# Patient Record
Sex: Female | Born: 1995 | Race: Black or African American | Hispanic: No | Smoking: Never smoker
Health system: Southern US, Community
[De-identification: ages and names within clinical notes are randomized; demographics above are authoritative.]

## PROBLEM LIST (undated history)

## (undated) DIAGNOSIS — I10 Essential (primary) hypertension: Secondary | ICD-10-CM

## (undated) DIAGNOSIS — F429 Obsessive-compulsive disorder, unspecified: Secondary | ICD-10-CM

## (undated) HISTORY — PX: WISDOM TOOTH EXTRACTION: SHX21

## (undated) HISTORY — DX: Obsessive-compulsive disorder, unspecified: F42.9

## (undated) HISTORY — PX: WRIST GANGLION EXCISION: SHX840

---
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
## 2013-02-08 ENCOUNTER — Ambulatory Visit
Admission: RE | Admit: 2013-02-08 | Discharge: 2013-02-08 | Disposition: A | Discharge status: Home / Self Care | Payer: Medicaid Other | Source: Ambulatory Visit | Attending: Pediatrics | Admitting: Pediatrics

## 2013-02-08 ENCOUNTER — Other Ambulatory Visit: Payer: Self-pay | Admitting: Pediatrics

## 2013-02-08 DIAGNOSIS — R079 Chest pain, unspecified: Secondary | ICD-10-CM

## 2013-05-08 ENCOUNTER — Other Ambulatory Visit: Payer: Self-pay | Admitting: *Deleted

## 2013-05-08 DIAGNOSIS — N390 Urinary tract infection, site not specified: Secondary | ICD-10-CM

## 2013-05-08 MED ORDER — NITROFURANTOIN MONOHYD MACRO 100 MG PO CAPS: 100.0000 mg | ORAL_CAPSULE | Freq: Two times a day (BID) | ORAL | Status: DC

## 2013-05-08 NOTE — Progress Notes (Signed)
Pt called to schedule an annual exam. Pt has c/o UTI symptoms. Burning with urination and odr to her urine. Rx sent to pharmacy. Pt to call back if symptoms persist.

## 2013-07-12 ENCOUNTER — Encounter: Payer: Self-pay | Admitting: Obstetrics

## 2013-07-12 ENCOUNTER — Ambulatory Visit: Payer: Self-pay | Admitting: Obstetrics

## 2013-07-12 ENCOUNTER — Ambulatory Visit (INDEPENDENT_AMBULATORY_CARE_PROVIDER_SITE_OTHER): Payer: Medicaid Other | Admitting: Obstetrics

## 2013-07-12 VITALS — BP 137/85 | HR 98 | Temp 97.5°F | Wt 125.0 lb

## 2013-07-12 DIAGNOSIS — Z3009 Encounter for other general counseling and advice on contraception: Secondary | ICD-10-CM | POA: Insufficient documentation

## 2013-07-12 DIAGNOSIS — B373 Candidiasis of vulva and vagina: Secondary | ICD-10-CM

## 2013-07-12 DIAGNOSIS — Z113 Encounter for screening for infections with a predominantly sexual mode of transmission: Secondary | ICD-10-CM

## 2013-07-12 DIAGNOSIS — N760 Acute vaginitis: Secondary | ICD-10-CM

## 2013-07-12 DIAGNOSIS — Z309 Encounter for contraceptive management, unspecified: Secondary | ICD-10-CM

## 2013-07-12 DIAGNOSIS — Z3202 Encounter for pregnancy test, result negative: Secondary | ICD-10-CM

## 2013-07-12 DIAGNOSIS — Z01419 Encounter for gynecological examination (general) (routine) without abnormal findings: Secondary | ICD-10-CM

## 2013-07-12 DIAGNOSIS — R300 Dysuria: Secondary | ICD-10-CM

## 2013-07-12 LAB — POCT URINALYSIS DIPSTICK
Bilirubin, UA: NEGATIVE
Ketones, UA: NEGATIVE
Spec Grav, UA: 1.02
pH, UA: 7

## 2013-07-12 MED ORDER — NITROFURANTOIN MONOHYD MACRO 100 MG PO CAPS: 100.0000 mg | ORAL_CAPSULE | Freq: Two times a day (BID) | ORAL | Status: DC

## 2013-07-12 NOTE — Progress Notes (Signed)
Subjective:    Regina Ramos is a 17 y.o. female who presents for sexually transmitted disease check and contraception consult. Sexual history reviewed with the patient. STI Exposure: denies knowledge of risky exposure. Previous history of STI trichomonas. Current symptoms vaginal discharge: white, thick and malodorous, vaginal irritation: moderate, spotting, dysuria: moderate, abnormal bleeding. Contraception: condoms Menstrual History: OB History   Grav Para Term Preterm Abortions TAB SAB Ect Mult Living   0 0 0 0 0 0 0 0 0 0       Menarche age: 61  Patient's last menstrual period was 06/12/2013.    The following portions of the patient's history were reviewed and updated as appropriate: allergies, current medications, past family history, past medical history, past social history, past surgical history and problem list.  Review of Systems Pertinent items are noted in HPI.    Objective:    BP 137/85  Pulse 98  Temp(Src) 97.5 F (36.4 C) (Oral)  Wt 125 lb (56.7 kg)  LMP 06/12/2013 General:   alert and no distress  Lymph Nodes:   Cervical, supraclavicular, and axillary nodes normal.  Pelvis:  External genitalia: normal general appearance Vaginal: normal mucosa without prolapse or lesions and discharge, grey and malodorous Cervix: normal appearance Adnexa: normal bimanual exam Uterus: normal single, nontender  Cultures:  GC and Chlamydia genprobes and wet prep     Assessment:     Healthy female exam.   Vaginitis, candida and BV      Contraceptive options discussed.   Plan:    Discussed safe sexual practice in detail Appropriate educational material was distributed See orders for STD cultures and assays Pt will call for results RTC 1 year.

## 2013-07-13 ENCOUNTER — Encounter: Payer: Self-pay | Admitting: Obstetrics

## 2013-07-13 DIAGNOSIS — B373 Candidiasis of vulva and vagina: Secondary | ICD-10-CM | POA: Insufficient documentation

## 2013-07-13 LAB — WET PREP BY MOLECULAR PROBE
Gardnerella vaginalis: NEGATIVE
Trichomonas vaginosis: NEGATIVE

## 2013-07-13 LAB — GC/CHLAMYDIA PROBE AMP: GC Probe RNA: NEGATIVE

## 2013-07-13 LAB — HIV ANTIBODY (ROUTINE TESTING W REFLEX): HIV: NONREACTIVE

## 2013-07-13 MED ORDER — METRONIDAZOLE 500 MG PO TABS: 500.0000 mg | ORAL_TABLET | Freq: Two times a day (BID) | ORAL | Status: DC

## 2013-07-13 MED ORDER — FLUCONAZOLE 150 MG PO TABS: 150.0000 mg | ORAL_TABLET | Freq: Once | ORAL | Status: DC

## 2013-07-19 ENCOUNTER — Telehealth: Payer: Self-pay | Admitting: *Deleted

## 2013-07-19 NOTE — Telephone Encounter (Signed)
ATC pt 07/17/13 at 1736 hrs and received fax modem ring tone, hung up and tried again and received busy signal.  Pt given prescription for treatment at office visit. Pt reports is still taking Flagyl. I advised pt to continue same and take already prescribed Diflucan after she completes that course of antibiotic. Pt advised STD results being negative. Pt expressed understanding.

## 2013-08-03 ENCOUNTER — Encounter: Payer: Self-pay | Admitting: Obstetrics

## 2013-08-03 ENCOUNTER — Ambulatory Visit (INDEPENDENT_AMBULATORY_CARE_PROVIDER_SITE_OTHER): Payer: Medicaid Other | Admitting: Obstetrics

## 2013-08-03 VITALS — BP 121/73 | HR 108 | Temp 97.7°F | Wt 127.0 lb

## 2013-08-03 DIAGNOSIS — Z3202 Encounter for pregnancy test, result negative: Secondary | ICD-10-CM

## 2013-08-03 DIAGNOSIS — Z30017 Encounter for initial prescription of implantable subdermal contraceptive: Secondary | ICD-10-CM

## 2013-08-03 LAB — POCT URINE PREGNANCY: Preg Test, Ur: NEGATIVE

## 2013-08-03 NOTE — Progress Notes (Signed)
NEXPLANON INSERTION NOTE  Date of LMP:   07/30/2013  Contraception used: Abstinence  Pregnancy test result:  Lab Results  Component Value Date   PREGTESTUR Negative 08/03/2013    Indications:  The patient desires contraception.  She understands risks, benefits, and alternatives to Implanon and would like to proceed.  Anesthesia:   Lidocaine 1% plain.  Procedure:  A time-out was performed confirming the procedure and the patient's allergy status.  The patient's non-dominant was identified as the left arm.  The protection cap was removed. While placing countertraction on the skin, the needle was inserted at a 30 degree angle.  The applicator was held horizontal to the skin; the skin was tented upward as the needle was introduced into the subdermal space.  While holding the applicator in place, the slider was unlocked. The Nexplanon was removed from the field.  The Nexplanon was palpated to ensure proper placement.  Complications: None  Instructions:  The patient was instructed to remove the dressing in 24 hours and that some bruising is to be expected.  She was advised to use over the counter analgesics as needed for any pain at the site.  She is to keep the area dry for 24 hours and to call if her hand or arm becomes cold, numb, or blue.  Return visit:  Return in 2 weeks

## 2013-08-04 ENCOUNTER — Encounter: Payer: Self-pay | Admitting: Obstetrics

## 2013-08-24 ENCOUNTER — Ambulatory Visit: Payer: Medicaid Other | Admitting: Obstetrics

## 2013-08-31 ENCOUNTER — Ambulatory Visit: Payer: Medicaid Other | Admitting: Obstetrics

## 2013-09-07 ENCOUNTER — Ambulatory Visit (INDEPENDENT_AMBULATORY_CARE_PROVIDER_SITE_OTHER): Payer: Medicaid Other | Admitting: Obstetrics

## 2013-09-07 VITALS — BP 121/81 | HR 72 | Temp 99.0°F | Ht 66.0 in | Wt 125.0 lb

## 2013-09-07 DIAGNOSIS — Z113 Encounter for screening for infections with a predominantly sexual mode of transmission: Secondary | ICD-10-CM

## 2013-09-07 DIAGNOSIS — R300 Dysuria: Secondary | ICD-10-CM

## 2013-09-07 DIAGNOSIS — Z3046 Encounter for surveillance of implantable subdermal contraceptive: Secondary | ICD-10-CM

## 2013-09-07 LAB — POCT URINALYSIS DIPSTICK
Glucose, UA: NEGATIVE
Nitrite, UA: NEGATIVE
Protein, UA: NEGATIVE
Spec Grav, UA: 1.015
Urobilinogen, UA: NEGATIVE
pH, UA: 7

## 2013-09-07 NOTE — Progress Notes (Signed)
Subjective:     Regina Ramos is a 17 y.o. female here for a routine exam.  Current complaints: Patient is in the office for follow up to the Nexplanon. Patient reports she has had some prolonged bleeding, but it is light and seems it is going to stop. Patient is having some Stinging with urination a. Patient is requesting a blood test today , also.  Personal health questionnaire reviewed: yes.   Gynecologic History No LMP recorded. Patient has had an implant. Contraception: Nexplanon   Obstetric History OB History  Gravida Para Term Preterm AB SAB TAB Ectopic Multiple Living  0 0 0 0 0 0 0 0 0 0          The following portions of the patient's history were reviewed and updated as appropriate: allergies, current medications, past family history, past medical history, past social history, past surgical history and problem list.  Review of Systems Pertinent items are noted in HPI.    Objective:    PE:  Left arm:  Rod palpated.  Site well  Assessment:    Healthy female exam.  S/P Nexplanon insertion.   Plan:    Education reviewed: safe sex/STD prevention. Contraception: Nexplanon. Follow up in: several months.

## 2013-09-08 ENCOUNTER — Encounter: Payer: Self-pay | Admitting: Obstetrics

## 2013-09-08 LAB — HEPATITIS B SURFACE ANTIGEN: Hepatitis B Surface Ag: NEGATIVE

## 2014-03-08 ENCOUNTER — Emergency Department (HOSPITAL_COMMUNITY)
Admission: EM | Admit: 2014-03-08 | Discharge: 2014-03-08 | Disposition: A | Discharge status: Home / Self Care | Payer: No Typology Code available for payment source | Attending: Emergency Medicine | Admitting: Emergency Medicine

## 2014-03-08 ENCOUNTER — Encounter (HOSPITAL_COMMUNITY): Payer: Self-pay | Admitting: Emergency Medicine

## 2014-03-08 DIAGNOSIS — IMO0002 Reserved for concepts with insufficient information to code with codable children: Secondary | ICD-10-CM | POA: Insufficient documentation

## 2014-03-08 DIAGNOSIS — Z79899 Other long term (current) drug therapy: Secondary | ICD-10-CM | POA: Insufficient documentation

## 2014-03-08 DIAGNOSIS — Z8659 Personal history of other mental and behavioral disorders: Secondary | ICD-10-CM | POA: Insufficient documentation

## 2014-03-08 DIAGNOSIS — Y92410 Unspecified street and highway as the place of occurrence of the external cause: Secondary | ICD-10-CM | POA: Insufficient documentation

## 2014-03-08 DIAGNOSIS — Y9389 Activity, other specified: Secondary | ICD-10-CM | POA: Insufficient documentation

## 2014-03-08 DIAGNOSIS — M549 Dorsalgia, unspecified: Secondary | ICD-10-CM

## 2014-03-08 MED ORDER — IBUPROFEN 800 MG PO TABS: 800.0000 mg | ORAL_TABLET | Freq: Three times a day (TID) | ORAL | Status: DC

## 2014-03-08 MED ORDER — IBUPROFEN 800 MG PO TABS
800.0000 mg | ORAL_TABLET | Freq: Once | ORAL | Status: AC
  Administered 2014-03-08: 800 mg via ORAL
  Filled 2014-03-08: qty 1

## 2014-03-08 NOTE — Discharge Instructions (Signed)
Take Ibuprofen as needed for pain. Refer to attached documents for more information.  °
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
## 2014-03-09 NOTE — ED Provider Notes (Signed)
Medical screening examination/treatment/procedure(s) were performed by non-physician practitioner and as supervising physician I was immediately available for consultation/collaboration.   Sherrika Weakland David Nikola Blackston III, MD 03/09/14 1242 

## 2014-09-06 ENCOUNTER — Emergency Department (HOSPITAL_COMMUNITY)
Admission: EM | Admit: 2014-09-06 | Discharge: 2014-09-06 | Disposition: A | Discharge status: Home / Self Care | Payer: Medicaid Other | Attending: Emergency Medicine | Admitting: Emergency Medicine

## 2014-09-06 ENCOUNTER — Encounter (HOSPITAL_COMMUNITY): Payer: Self-pay | Admitting: Emergency Medicine

## 2014-09-06 DIAGNOSIS — Z3202 Encounter for pregnancy test, result negative: Secondary | ICD-10-CM | POA: Diagnosis not present

## 2014-09-06 DIAGNOSIS — Z8659 Personal history of other mental and behavioral disorders: Secondary | ICD-10-CM | POA: Insufficient documentation

## 2014-09-06 DIAGNOSIS — R110 Nausea: Secondary | ICD-10-CM | POA: Insufficient documentation

## 2014-09-06 DIAGNOSIS — Z79899 Other long term (current) drug therapy: Secondary | ICD-10-CM | POA: Diagnosis not present

## 2014-09-06 DIAGNOSIS — R51 Headache: Secondary | ICD-10-CM | POA: Diagnosis present

## 2014-09-06 DIAGNOSIS — R519 Headache, unspecified: Secondary | ICD-10-CM

## 2014-09-06 LAB — POC URINE PREG, ED: Preg Test, Ur: NEGATIVE

## 2014-09-06 MED ORDER — KETOROLAC TROMETHAMINE 60 MG/2ML IM SOLN
60.0000 mg | Freq: Once | INTRAMUSCULAR | Status: AC
  Administered 2014-09-06: 60 mg via INTRAMUSCULAR
  Filled 2014-09-06: qty 2

## 2014-09-06 NOTE — Discharge Instructions (Signed)
Take the prescribed medication as directed. Follow-up with Dr. Cathey Endow to schedule an eye exam. Return to the ED for new or worsening symptoms.

## 2014-09-06 NOTE — ED Notes (Signed)
C/o nausea/headache

## 2014-09-06 NOTE — Progress Notes (Signed)
     CARE MANAGEMENT ED NOTE 09/06/2014  Patient:  Regina Ramos, Regina Ramos   Account Number:  [id number]  Date Initiated:  09/06/2014  Documentation initiated by:  Edd Arbour  Subjective/Objective Assessment:   18 yr old [place] C/o nausea/headache     Subjective/Objective Assessment Detail:   " I aged out of medicaid" I have just got new insurance through my job  No pcp listed for pt     Action/Plan:   see notes below   Action/Plan Detail:   Anticipated DC Date:  09/06/2014     Status Recommendation to Physician:   Result of Recommendation:    Other ED Services  Consult Working Plan    DC Planning Services  Other  PCP issues  Outpatient Services - Pt will follow up    Choice offered to / List presented to:            Status of service:  Completed, signed off  ED Comments:   ED Comments Detail:  [facility] ED CM spoke with pt on how to obtain an in network pcp with insurance coverage via the customer service number or web site Cm reviewed ED level of care for crisis/emergent services and community pcp level of care to manage continuous or chronic medical concerns.  The pt voiced understanding CM encouraged pt and discussed pt's responsibility to verify with pt's insurance carrier that any recommended medical provider offered by any emergency room or a hospital provider is within the carrier's network. The pt voiced understanding

## 2014-09-06 NOTE — ED Provider Notes (Signed)
CSN: [id number]     Arrival date & time 09/06/14  1415 History  This chart was scribed for non-physician practitioner, Sharilyn Sites, PA-C working with Suzi Roots, MD by Greggory Stallion, ED scribe. This patient was seen in room WTR7/WTR7 and the patient's care was started at 3:10 PM.   Chief Complaint  Patient presents with  . Headache   The history is provided by the patient. No language interpreter was used.   HPI Comments: Regina Ramos is a 18 y.o. female who presents to the Emergency Department complaining of intermittent headaches that started about 2 weeks ago. Reports associated nausea and photophobia.  States headache occurs around both of her eyes. Denies head injury or trauma to eye. States pain worse with reading or trying to concentrate.  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 Pt has not seen an eye doctor in a while, wears glasses on a daily basis. She has taken aleve with no relief. States it normally helps with regular headaches.  No fever, chills, sweats, neck pain, vomiting, visual disturbance, tinnitus, changes in speech, difficulty ambulating.  Past Medical History  Diagnosis Date  . OCD (obsessive compulsive disorder)    Past Surgical History  Procedure Laterality Date  . Wrist ganglion excision Left   . Wisdom tooth extraction     Family History  Problem Relation Age of Onset  . Adopted: Yes   History  Substance Use Topics  . Smoking status: Never Smoker   . Smokeless tobacco: Never Used  . Alcohol Use: No   OB History   Grav Para Term Preterm Abortions TAB SAB Ect Mult Living       Review of Systems  Eyes: Positive for photophobia.  Gastrointestinal: Positive for nausea.  Neurological: Positive for headaches.  All other systems reviewed and are negative.  Allergies  Codeine  Home Medications   Prior to Admission medications   Medication Sig  Start Date End Date Taking? Authorizing Provider  etonogestrel (NEXPLANON) 68 MG IMPL implant Inject 1 each into the skin once.    Historical Provider, MD  ibuprofen (ADVIL,MOTRIN) 800 MG tablet Take 1 tablet (800 mg total) by mouth 3 (three) times daily. 03/08/14   Kaitlyn Szekalski, PA-C  lansoprazole (PREVACID) 30 MG capsule Take 30 mg by mouth daily.    Historical Provider, MD   BP 139/95  Pulse 88  Temp(Src) 98.2 F (36.8 C) (Oral)  Resp 16  SpO2 100%  Physical Exam  Nursing note and vitals reviewed. Constitutional: She is oriented to person, place, and time. She appears well-developed and well-nourished. No distress.  HENT:  Head: Normocephalic and atraumatic.  Mouth/Throat: Oropharynx is clear and moist.  Eyes: Conjunctivae and EOM are normal. Pupils are equal, round, and reactive to light.  Neck: Normal range of motion and full passive range of motion without pain. Neck supple. No spinous process tenderness and no muscular tenderness present. No rigidity. Normal range of motion present.  No meningismus  Cardiovascular: Normal rate, regular rhythm and normal heart sounds.   Pulmonary/Chest: Effort normal and breath sounds normal. No respiratory distress. She has no wheezes.  Abdominal: Soft. Bowel sounds are normal.  Musculoskeletal: Normal range of motion.  Neurological: She is alert and oriented to person, place, and time.  AAOx3, answering questions appropriately; equal strength UE and LE bilaterally; CN grossly intact;  moves all extremities appropriately without ataxia; no focal neuro deficits or facial asymmetry appreciated  Skin: Skin is warm and dry. She is not diaphoretic.  Psychiatric: She has a normal mood and affect.    ED Course  Procedures (including critical care time)  DIAGNOSTIC STUDIES: Oxygen Saturation is 100% on RA, normal by my interpretation.    COORDINATION OF CARE: 3:14 PM-Discussed treatment plan which includes headache medications with pt at  bedside and pt agreed to plan. Advised pt to follow up with her eye doctor.   Labs Review Labs Reviewed  POC URINE PREG, ED    Imaging Review No results found.   EKG Interpretation None      MDM   Final diagnoses:  Headache, unspecified headache type   Intermittent headaches x2 weeks.  No focal neurologic deficits or meningismus on exam. Patient is afebrile overall nontoxic.  Given toradol with some improvement of headache.  Sx likely due to eye strain given her new job working on [employer].  Will have patient FU with ophthalmology to schedule routine eye exam at has been several years since her last exam.  Discussed plan with patient, he/she acknowledged understanding and agreed with plan of care.  Return precautions given for new or worsening symptoms.  I personally performed the services described in this documentation, which was scribed in my presence. The recorded information has been reviewed and is accurate.  Garlon Hatchet, PA-C 09/06/14 [phone]

## 2014-09-07 NOTE — ED Provider Notes (Signed)
Medical screening examination/treatment/procedure(s) were performed by non-physician practitioner and as supervising physician I was immediately available for consultation/collaboration.    Rahkim Rabalais E [place] Giammona, MD 09/07/14 0729 

## 2014-10-24 ENCOUNTER — Encounter (HOSPITAL_COMMUNITY): Payer: Self-pay | Admitting: *Deleted

## 2014-10-24 DIAGNOSIS — F42 Obsessive-compulsive disorder: Secondary | ICD-10-CM | POA: Diagnosis not present

## 2014-10-24 DIAGNOSIS — Z3202 Encounter for pregnancy test, result negative: Secondary | ICD-10-CM | POA: Diagnosis not present

## 2014-10-24 DIAGNOSIS — R300 Dysuria: Secondary | ICD-10-CM | POA: Insufficient documentation

## 2014-10-24 DIAGNOSIS — B373 Candidiasis of vulva and vagina: Secondary | ICD-10-CM | POA: Diagnosis not present

## 2014-10-24 DIAGNOSIS — N898 Other specified noninflammatory disorders of vagina: Secondary | ICD-10-CM | POA: Diagnosis present

## 2014-10-24 NOTE — ED Notes (Signed)
The pt is c/o a vaginal discharge and her urine has a foul smell for 2 weeks.  lmp  None inplanbt

## 2014-10-25 ENCOUNTER — Emergency Department (HOSPITAL_COMMUNITY)
Admission: EM | Admit: 2014-10-25 | Discharge: 2014-10-25 | Disposition: A | Discharge status: Home / Self Care | Payer: BC Managed Care – PPO | Attending: Emergency Medicine | Admitting: Emergency Medicine

## 2014-10-25 DIAGNOSIS — B3731 Acute candidiasis of vulva and vagina: Secondary | ICD-10-CM

## 2014-10-25 DIAGNOSIS — B373 Candidiasis of vulva and vagina: Secondary | ICD-10-CM | POA: Diagnosis not present

## 2014-10-25 LAB — URINE MICROSCOPIC-ADD ON

## 2014-10-25 LAB — WET PREP, GENITAL: Trich, Wet Prep: NONE SEEN

## 2014-10-25 LAB — URINALYSIS, ROUTINE W REFLEX MICROSCOPIC
BILIRUBIN URINE: NEGATIVE
GLUCOSE, UA: NEGATIVE mg/dL
Ketones, ur: NEGATIVE mg/dL
Nitrite: NEGATIVE
PROTEIN: NEGATIVE mg/dL
SPECIFIC GRAVITY, URINE: 1.031 — AB (ref 1.005–1.030)
UROBILINOGEN UA: 1 mg/dL (ref 0.0–1.0)
pH: 6 (ref 5.0–8.0)

## 2014-10-25 LAB — PREGNANCY, URINE: Preg Test, Ur: NEGATIVE

## 2014-10-25 MED ORDER — FLUCONAZOLE 100 MG PO TABS
150.0000 mg | ORAL_TABLET | Freq: Once | ORAL | Status: AC
  Administered 2014-10-25: 150 mg via ORAL
  Filled 2014-10-25: qty 2

## 2014-10-25 NOTE — ED Provider Notes (Signed)
CSN: [id number]     Arrival date & time 10/24/14  2336 History   First MD Initiated Contact with Patient 10/25/14 0147     Chief Complaint  Patient presents with  . Vaginal Discharge    (Consider location/radiation/quality/duration/timing/severity/associated sxs/prior Treatment) HPI Comments: Patient is a 18 year old female with a history of OCD who presents to the emergency department for further evaluation of vaginal discharge. Patient states that her vaginal discharge began a few weeks ago, but has worsened over the last 4 days. Patient states that she noticed that her discharge has become thicker. She also has noticed a urinary odor and labial sensitivity. Patient states that flexion against her genital area causes worsening discomfort. She states that she has noticed a skin tear near her clitoris which has increased pain when urinating. Patient denies any recent sexual intercourse or dyspareunia. She denies associated fever, abdominal pain, nausea, vomiting, genital lesions, and hematuria. Patient denies taking any medications for symptoms. She has had one sexual partner in the last 6 months. She endorses a concern for STDs. Her last STD check was one year ago. No history of prior pregnancies.  Patient is a 18 y.o. female presenting with vaginal discharge. The history is provided by the patient. No language interpreter was used.  Vaginal Discharge Associated symptoms: dysuria     Past Medical History  Diagnosis Date  . OCD (obsessive compulsive disorder)    Past Surgical History  Procedure Laterality Date  . Wrist ganglion excision Left   . Wisdom tooth extraction     Family History  Problem Relation Age of Onset  . Adopted: Yes   History  Substance Use Topics  . Smoking status: Never Smoker   . Smokeless tobacco: Never Used  . Alcohol Use: No   OB History    Gravida Para Term Preterm AB TAB SAB Ectopic Multiple Living   0 0 0 0 0 0 0 0 0 0       Review of Systems   Genitourinary: Positive for dysuria and vaginal discharge. Negative for hematuria and vaginal bleeding.  Skin: Positive for wound.  All other systems reviewed and are negative.   Allergies  Codeine  Home Medications   Prior to Admission medications   Medication Sig Start Date End Date Taking? Authorizing Provider  etonogestrel (NEXPLANON) 68 MG IMPL implant Inject 1 each into the skin once.    Historical Provider, MD  naproxen sodium (ANAPROX) 220 MG tablet Take 220 mg by mouth 2 (two) times daily with a meal.    Historical Provider, MD   BP 133/89 mmHg  Pulse 92  Temp(Src) 98.6 F (37 C) (Oral)  Resp 14  Ht 5\' 7"  (1.702 m)  Wt 150 lb (68.04 kg)  BMI 23.49 kg/m2  SpO2 99%   Physical Exam  Constitutional: She is oriented to person, place, and time. She appears well-developed and well-nourished. No distress.  Nontoxic/nonseptic appearing  HENT:  Head: Normocephalic and atraumatic.  Eyes: Conjunctivae and EOM are normal. No scleral icterus.  Neck: Normal range of motion.  Pulmonary/Chest: Effort normal. No respiratory distress.  Chest expansion symmetric. Respirations even and unlabored.  Abdominal: Soft. She exhibits no distension. There is no tenderness. There is no rebound and no guarding.  Soft and nontender. No peritoneal signs or masses.  Genitourinary: There is no rash, tenderness, lesion or injury on the right labia. There is no rash, tenderness, lesion or injury on the left labia. Uterus is not tender. Cervix exhibits no motion tenderness and  no friability. Right adnexum displays no mass, no tenderness and no fullness. Left adnexum displays no mass, no tenderness and no fullness. No bleeding in the vagina. No foreign body around the vagina. Vaginal discharge (thick white, curd-like discharge) found.  Musculoskeletal: Normal range of motion.  Neurological: She is alert and oriented to person, place, and time. She exhibits normal muscle tone. Coordination normal.  GCS 15.  Speech is goal oriented. Patient moves extremities without ataxia.  Skin: Skin is warm and dry. No rash noted. She is not diaphoretic. No erythema. No pallor.  Psychiatric: She has a normal mood and affect. Her behavior is normal.  Nursing note and vitals reviewed.   ED Course  Procedures (including critical care time) Labs Review Labs Reviewed  WET PREP, GENITAL - Abnormal; Notable for the following:    Yeast Wet Prep HPF POC FEW (*)    Clue Cells Wet Prep HPF POC FEW (*)    WBC, Wet Prep HPF POC FEW (*)    All other components within normal limits  URINALYSIS, ROUTINE W REFLEX MICROSCOPIC - Abnormal; Notable for the following:    APPearance CLOUDY (*)    Specific Gravity, Urine 1.031 (*)    Hgb urine dipstick MODERATE (*)    Leukocytes, UA SMALL (*)    All other components within normal limits  URINE MICROSCOPIC-ADD ON - Abnormal; Notable for the following:    Squamous Epithelial / LPF MANY (*)    Bacteria, UA MANY (*)    All other components within normal limits  GC/CHLAMYDIA PROBE AMP  PREGNANCY, URINE    Imaging Review No results found.   EKG Interpretation None      MDM   Final diagnoses:  Candidal vulvovaginitis    18 year old female presents to the emergency department for further evaluation of vaginal discharge and vaginal irritation. No complaints of abdominal pain. Patient is afebrile on arrival to the emergency department. Patient found have no abdominal tenderness on exam. No adnexal or cervical motion tenderness. Urinalysis today is consistent with a contaminated sample given many squamous cells. She does endorse a skin tear to her labia/clitoral area. Suspect that this is the source of her RBCs in her urine today.  Yeast present in UA sample. Wet prep today also shows few yeast. Patient's symptoms and physical exam are c/w a yeast infection. Patient tx in ED with diflucan. GC/Chlamydia cultures are pending; patient will be notified if cultures are positive.  Patient instructed to f/u with her OB/GYN to further discuss symptoms today. She is stable for discharge at this time. Return precautions provided and patient discharged in good condition; VSS.   Filed Vitals:   10/24/14 2341 10/25/14 0303  BP: 153/97 133/89  Pulse: 106 92  Temp: 97.5 F (36.4 C) 98.6 F (37 C)  TempSrc:  Oral  Resp: 16 14  Height: 5\' 7"  (1.702 m)   Weight: 150 lb (68.04 kg)   SpO2: 98% 99%     Antony MaduraKelly Yareliz Thorstenson, PA-C 10/25/14 [id number]  Derwood KaplanAnkit Nanavati, MD 10/25/14 0840

## 2014-10-25 NOTE — ED Notes (Signed)
Patient is alert and orientedx4.  Patient was explained discharge instructions and they understood them with no questions.  The patient's boyfriend, Kris HartmannJaywin Fox is taking the patient home.

## 2014-10-25 NOTE — Discharge Instructions (Signed)
You have been treated for this in the ED with Diflucan. Follow up with your OBGYN. Return [facility] or to their outpatient clinic as needed if symptoms persist or worsen.  Candidal Vulvovaginitis Candidal vulvovaginitis is an infection of the vagina and vulva. The vulva is the skin around the opening of the vagina. This may cause itching and discomfort in and around the vagina.  HOME CARE  Only take medicine as told by your doctor.  Do not have sex (intercourse) until the infection is healed or as told by your doctor.  Practice safe sex.  Tell your sex partner about your infection.  Do not douche or use tampons.  Wear cotton underwear. Do not wear tight pants or panty hose.  Eat yogurt. This may help treat and prevent yeast infections. GET HELP RIGHT AWAY IF:   You have a fever.  Your problems get worse during treatment or do not get better in 3 days.  You have discomfort, irritation, or itching in your vagina or vulva area.  You have pain after sex.  You start to get belly (abdominal) pain. MAKE SURE YOU:  Understand these instructions.  Will watch your condition.  Will get help right away if you are not doing well or get worse. Document Released: 03/05/2009 Document Revised: 12/12/2013 Document Reviewed: 03/05/2009 [facility] Patient Information 2015 [place], [place]. This information is not intended to replace advice given to you by your health care provider. Make sure you discuss any questions you have with your health care provider.

## 2014-10-26 LAB — GC/CHLAMYDIA PROBE AMP
CT Probe RNA: NEGATIVE
GC Probe RNA: NEGATIVE

## 2014-10-31 ENCOUNTER — Encounter (HOSPITAL_COMMUNITY): Payer: Self-pay | Admitting: Emergency Medicine

## 2014-10-31 DIAGNOSIS — R51 Headache: Secondary | ICD-10-CM | POA: Insufficient documentation

## 2014-10-31 DIAGNOSIS — Z8659 Personal history of other mental and behavioral disorders: Secondary | ICD-10-CM | POA: Diagnosis not present

## 2014-10-31 DIAGNOSIS — Z79899 Other long term (current) drug therapy: Secondary | ICD-10-CM | POA: Insufficient documentation

## 2014-10-31 NOTE — ED Notes (Signed)
Pt. reports left side headache onset last week , denies nausea or vomitting / no blurred vision .

## 2014-11-01 ENCOUNTER — Emergency Department (HOSPITAL_COMMUNITY)
Admission: EM | Admit: 2014-11-01 | Discharge: 2014-11-01 | Disposition: A | Discharge status: Home / Self Care | Payer: BC Managed Care – PPO | Attending: Emergency Medicine | Admitting: Emergency Medicine

## 2014-11-01 DIAGNOSIS — R51 Headache: Secondary | ICD-10-CM | POA: Diagnosis not present

## 2014-11-01 DIAGNOSIS — R519 Headache, unspecified: Secondary | ICD-10-CM

## 2014-11-01 MED ORDER — DIPHENHYDRAMINE HCL 50 MG/ML IJ SOLN
25.0000 mg | Freq: Once | INTRAMUSCULAR | Status: AC
  Administered 2014-11-01: 25 mg via INTRAVENOUS
  Filled 2014-11-01: qty 1

## 2014-11-01 MED ORDER — PROCHLORPERAZINE EDISYLATE 5 MG/ML IJ SOLN
10.0000 mg | Freq: Once | INTRAMUSCULAR | Status: AC
  Administered 2014-11-01: 10 mg via INTRAVENOUS
  Filled 2014-11-01: qty 2

## 2014-11-01 NOTE — ED Provider Notes (Signed)
CSN: [id number]     Arrival date & time 10/31/14  2345 History   First MD Initiated Contact with Patient 11/01/14 0204     Chief Complaint  Patient presents with  . Headache     (Consider location/radiation/quality/duration/timing/severity/associated sxs/prior Treatment) HPI Comments: Patient is a [age] year old female presented to the emergency department for one week of intermittent episodes of throbbing headaches. Patient states it rotates sides, is currently on her left side. She denies any nausea, vomiting, visual disturbance, fever, chills, photophobia, phonophobia. She has tried Motrin intermittently with minimal improvement. She states she had a similar headache back in September, states "they gave me a shot in the emergency department and got better." She has not followed up with a primary care physician or a neurologist.   Past Medical History  Diagnosis Date  . OCD (obsessive compulsive disorder)    Past Surgical History  Procedure Laterality Date  . Wrist ganglion excision Left   . Wisdom tooth extraction     Family History  Problem Relation Age of Onset  . Adopted: Yes   History  Substance Use Topics  . Smoking status: Never Smoker   . Smokeless tobacco: Never Used  . Alcohol Use: No   OB History    Gravida Para Term Preterm AB TAB SAB Ectopic Multiple Living   0 0 0 0 0 0 0 0 0 0      Review of Systems  Neurological: Positive for headaches.  All other systems reviewed and are negative.     Allergies  Codeine  Home Medications   Prior to Admission medications   Medication Sig Start Date End Date Taking? Authorizing Provider  etonogestrel (NEXPLANON) 68 MG IMPL implant Inject 1 each into the skin once.   Yes Historical Provider, MD  naproxen sodium (ANAPROX) 220 MG tablet Take 220 mg by mouth 2 (two) times daily with a meal.   Yes Historical Provider, MD   BP 132/83 mmHg  Pulse 87  Temp(Src) 98.2 F (36.8 C)  Resp 16  Ht 5\' 7"  (1.702 m)  Wt 150 lb  (68.04 kg)  BMI 23.49 kg/m2  SpO2 100% Physical Exam  Constitutional: She is oriented to person, place, and time. She appears well-developed and well-nourished. No distress.  HENT:  Head: Normocephalic and atraumatic.  Right Ear: External ear normal.  Left Ear: External ear normal.  Nose: Nose normal.  Mouth/Throat: Oropharynx is clear and moist. No oropharyngeal exudate.  Eyes: Conjunctivae and EOM are normal. Pupils are equal, round, and reactive to light.  Neck: Normal range of motion. Neck supple.  Cardiovascular: Normal rate, regular rhythm, normal heart sounds and intact distal pulses.   Pulmonary/Chest: Effort normal and breath sounds normal. No respiratory distress.  Abdominal: Soft. There is no tenderness.  Neurological: She is alert and oriented to person, place, and time. She has normal strength. No cranial nerve deficit. Gait normal. GCS eye subscore is 4. GCS verbal subscore is 5. GCS motor subscore is 6.  Sensation grossly intact.  No pronator drift.  Bilateral heel-knee-shin intact.  Skin: Skin is warm and dry. She is not diaphoretic.  Nursing note and vitals reviewed.   ED Course  Procedures (including critical care time) Medications  diphenhydrAMINE (BENADRYL) injection 25 mg (25 mg Intravenous Given 11/01/14 0232)  prochlorperazine (COMPAZINE) injection 10 mg (10 mg Intravenous Given 11/01/14 0232)    Labs Review Labs Reviewed - No data to display  Imaging Review No results found.   EKG Interpretation None  MDM   Final diagnoses:  Bad headache    Filed Vitals:   11/01/14 0230  BP: 140/79  Pulse: 80  Temp:   Resp:    Afebrile, NAD, non-toxic appearing, AAOx4.  Pt HA treated and improved while in ED.  Presentation is like pts typical HA and non concerning for [facility], ICH, Meningitis, or temporal arteritis. Pt is afebrile with no focal neuro deficits, nuchal rigidity, or change in vision. Pt is to follow up with PCP to discuss prophylactic  medication. Pt verbalizes understanding and is agreeable with plan to dc.  Patient is stable at time of discharge     Jeannetta EllisJennifer L Glendell Fouse, PA-C 11/02/14 0327  April K Palumbo-Rasch, MD 11/02/14 [id number]

## 2014-11-01 NOTE — ED Notes (Signed)
Pt A&OX4, ambulatory at d/c with steady gait, NAD 

## 2014-11-01 NOTE — Discharge Instructions (Signed)
Please follow up with your primary care physician in 1-2 days. If you do not have one please call the [facility] listed above. [facility] up [facility]. Please read all discharge instructions and return precautions. Please read all discharge instructions and return precautions.   General Headache Without Cause A headache is pain or discomfort felt around the head or neck area. The specific cause of a headache may not be found. There are many causes and types of headaches. A few common ones are:  Tension headaches.  Migraine headaches.  Cluster headaches.  Chronic daily headaches. HOME CARE INSTRUCTIONS   Keep all follow-up appointments with your caregiver or any specialist referral.  Only take over-the-counter or prescription medicines for pain or discomfort as directed by your caregiver.  Lie down in a dark, quiet room when you have a headache.  Keep a headache journal to find out what may trigger your migraine headaches. For example, write down:  What you eat and drink.  How much sleep you get.  Any change to your diet or medicines.  Try massage or other relaxation techniques.  Put ice packs or heat on the head and neck. Use these 3 to 4 times per day for 15 to 20 minutes each time, or as needed.  Limit stress.  Sit up straight, and do not tense your muscles.  Quit smoking if you smoke.  Limit alcohol use.  Decrease the amount of caffeine you drink, or stop drinking caffeine.  Eat and sleep on a regular schedule.  Get 7 to 9 hours of sleep, or as recommended by your caregiver.  Keep lights dim if bright lights bother you and make your headaches worse. SEEK MEDICAL CARE IF:   You have problems with the medicines you were prescribed.  Your medicines are not working.  You have a change from the usual headache.  You have nausea or vomiting. SEEK IMMEDIATE MEDICAL CARE IF:     Your headache becomes severe.  You have a fever.  You have a stiff neck.  You have loss of vision.  You have muscular weakness or loss of muscle control.  You start losing your balance or have trouble walking.  You feel faint or pass out.  You have severe symptoms that are different from your first symptoms. MAKE SURE YOU:   Understand these instructions.  Will watch your condition.  Will get help right away if you are not doing well or get worse. Document Released: 12/07/2005 Document Revised: 02/29/2012 Document Reviewed: 12/23/2011 [facility] Patient Information 2015 [place], [place]. This information is not intended to replace advice given to you by your health care provider. Make sure you discuss any questions you have with your health care provider.

## 2015-05-03 ENCOUNTER — Emergency Department (HOSPITAL_COMMUNITY)
Admission: EM | Admit: 2015-05-03 | Discharge: 2015-05-04 | Disposition: A | Discharge status: Home / Self Care | Payer: Medicaid Other | Attending: Emergency Medicine | Admitting: Emergency Medicine

## 2015-05-03 ENCOUNTER — Encounter (HOSPITAL_COMMUNITY): Payer: Self-pay | Admitting: Emergency Medicine

## 2015-05-03 ENCOUNTER — Emergency Department (HOSPITAL_COMMUNITY): Payer: Medicaid Other

## 2015-05-03 DIAGNOSIS — R110 Nausea: Secondary | ICD-10-CM | POA: Diagnosis not present

## 2015-05-03 DIAGNOSIS — Z79899 Other long term (current) drug therapy: Secondary | ICD-10-CM | POA: Insufficient documentation

## 2015-05-03 DIAGNOSIS — R0602 Shortness of breath: Secondary | ICD-10-CM | POA: Insufficient documentation

## 2015-05-03 DIAGNOSIS — R42 Dizziness and giddiness: Secondary | ICD-10-CM | POA: Diagnosis not present

## 2015-05-03 DIAGNOSIS — Z791 Long term (current) use of non-steroidal anti-inflammatories (NSAID): Secondary | ICD-10-CM | POA: Insufficient documentation

## 2015-05-03 DIAGNOSIS — R079 Chest pain, unspecified: Secondary | ICD-10-CM | POA: Diagnosis present

## 2015-05-03 DIAGNOSIS — Z3202 Encounter for pregnancy test, result negative: Secondary | ICD-10-CM | POA: Insufficient documentation

## 2015-05-03 LAB — BASIC METABOLIC PANEL
ANION GAP: 10 (ref 5–15)
BUN: 14 mg/dL (ref 6–20)
CALCIUM: 9.5 mg/dL (ref 8.9–10.3)
CO2: 25 mmol/L (ref 22–32)
Chloride: 103 mmol/L (ref 101–111)
Creatinine, Ser: 0.66 mg/dL (ref 0.44–1.00)
GFR calc Af Amer: >60 mL/min (ref >60)
GLUCOSE: 80 mg/dL (ref 65–99)
POTASSIUM: 4 mmol/L (ref 3.5–5.1)
Sodium: 138 mmol/L (ref 135–145)

## 2015-05-03 LAB — CBC
HEMATOCRIT: 39.5 % (ref 36.0–46.0)
Hemoglobin: 13.7 g/dL (ref 12.0–15.0)
MCH: 27.4 pg (ref 26.0–34.0)
MCHC: 34.7 g/dL (ref 30.0–36.0)
MCV: 79 fL (ref 78.0–100.0)
Platelets: 328 10*3/uL (ref 150–400)
RBC: 5 MIL/uL (ref 3.87–5.11)
RDW: 14.2 % (ref 11.5–15.5)
WBC: 10.5 10*3/uL (ref 4.0–10.5)

## 2015-05-03 LAB — I-STAT TROPONIN, ED: Troponin i, poc: 0 ng/mL (ref 0.00–0.08)

## 2015-05-03 LAB — BRAIN NATRIURETIC PEPTIDE: B NATRIURETIC PEPTIDE 5: 8.4 pg/mL (ref 0.0–100.0)

## 2015-05-03 LAB — POC URINE PREG, ED: Preg Test, Ur: NEGATIVE

## 2015-05-03 LAB — D-DIMER, QUANTITATIVE (NOT AT ARMC)

## 2015-05-03 MED ORDER — ONDANSETRON 4 MG PO TBDP
2.0000 mg | ORAL_TABLET | Freq: Once | ORAL | Status: AC
  Administered 2015-05-03: 2 mg via ORAL
  Filled 2015-05-03: qty 1

## 2015-05-03 NOTE — ED Notes (Signed)
C/o tightness to mid upper chest and tingling across chest since 6pm with sob and nausea.  States pain started while at work.

## 2015-05-03 NOTE — ED Provider Notes (Signed)
CSN: [id number]     Arrival date & time 05/03/15  2145 History   First MD Initiated Contact with Patient 05/03/15 2223     Chief Complaint  Patient presents with  . Chest Pain     (Consider location/radiation/quality/duration/timing/severity/associated sxs/prior Treatment) The history is provided by the patient. No language interpreter was used.  Regina Ramos is a 19 year old female with past medical history of OCD presenting to the ED with chest tightness, but patients, shortness of breath and tingling in her chest and shoulders with associated dizziness that started approximately 6:00 PM this evening while patient was at work. Patient reports she is a [employer]. Patient reported that this episode lasted for approximately 5 hours, disintegrating at approximately 11:00 PM. Patient reported that she was also experiencing nausea associated with it. Patient reports that at the time of the arrival to the hospital the tingling, dizziness and chest discomfort has assault. Reported that she continues to feel nauseous. Patient reported that she has been having some nasal congestion and a mild dry cough. Reported that she's been extremely stressed out between work and home life. Reported that she's taken nothing for discomfort. Reported that she is currently on Nexplanon August 2014. Denied throat closing sensation, fainting, blurred vision, sudden loss of vision, floaters, confusion, disorientation, vomiting, diarrhea, abdominal pain, hemoptysis, travels, leg swelling, melena, hematochezia, weakness, loss of sensation, fever, neck pain, neck stiffness, back pain. PCP none  Past Medical History  Diagnosis Date  . OCD (obsessive compulsive disorder)    Past Surgical History  Procedure Laterality Date  . Wrist ganglion excision Left   . Wisdom tooth extraction     Family History  Problem Relation Age of Onset  . Adopted: Yes   History  Substance Use Topics  . Smoking status: Never Smoker   .  Smokeless tobacco: Never Used  . Alcohol Use: No   OB History    Gravida Para Term Preterm AB TAB SAB Ectopic Multiple Living   0 0 0 0 0 0 0 0 0 0      Review of Systems  Constitutional: Negative for fever and chills.  Eyes: Negative for visual disturbance.  Respiratory: Positive for chest tightness and shortness of breath.   Cardiovascular: Positive for chest pain.  Gastrointestinal: Positive for nausea. Negative for vomiting, abdominal pain, diarrhea, constipation, blood in stool and anal bleeding.  Musculoskeletal: Negative for back pain, neck pain and neck stiffness.  Neurological: Positive for dizziness. Negative for weakness, numbness and headaches.      Allergies  Codeine  Home Medications   Prior to Admission medications   Medication Sig Start Date End Date Taking? Authorizing Provider  etonogestrel (NEXPLANON) 68 MG IMPL implant Inject 1 each into the skin once.   Yes Historical Provider, MD  lansoprazole (PREVACID) 30 MG capsule Take 30 mg by mouth daily at 12 noon.   Yes Historical Provider, MD  naproxen sodium (ANAPROX) 220 MG tablet Take 220 mg by mouth daily as needed (pain).    Yes Historical Provider, MD   BP 118/64 mmHg  Pulse 87  Temp(Src) 98.1 F (36.7 C) (Oral)  Resp 24  SpO2 99% Physical Exam  Constitutional: She is oriented to person, place, and time. She appears well-developed and well-nourished. No distress.  HENT:  Head: Normocephalic and atraumatic.  Mouth/Throat: Oropharynx is clear and moist. No oropharyngeal exudate.  Eyes: Conjunctivae and EOM are normal. Pupils are equal, round, and reactive to light. Right eye exhibits no discharge. Left eye exhibits  no discharge.  Neck: Normal range of motion. Neck supple. No tracheal deviation present.  Cardiovascular: Normal rate, regular rhythm and normal heart sounds.  Exam reveals no friction rub.   No murmur heard. Pulses:      Radial pulses are 2+ on the right side, and 2+ on the left side.        Dorsalis pedis pulses are 2+ on the right side, and 2+ on the left side.  Cap refill less than 3 seconds Negative swelling or pitting edema identified to lower extremities bilaterally  Pulmonary/Chest: Effort normal and breath sounds normal. No respiratory distress. She has no wheezes. She has no rales. She exhibits no tenderness.  Musculoskeletal: Normal range of motion.  Full ROM to upper and lower extremities without difficulty noted, negative ataxia noted.  Lymphadenopathy:    She has no cervical adenopathy.  Neurological: She is alert and oriented to person, place, and time. No cranial nerve deficit. She exhibits normal muscle tone. Coordination normal.  Skin: Skin is warm and dry. No rash noted. She is not diaphoretic. No erythema.  Psychiatric: She has a normal mood and affect. Her behavior is normal. Thought content normal.  Nursing note and vitals reviewed.   ED Course  Procedures (including critical care time)  Results for orders placed or performed during the hospital encounter of 05/03/15  CBC  Result Value Ref Range   WBC 10.5 4.0 - 10.5 K/uL   RBC 5.00 3.87 - 5.11 MIL/uL   Hemoglobin 13.7 12.0 - 15.0 g/dL   HCT 16.139.5 09.636.0 - 04.546.0 %   MCV 79.0 78.0 - 100.0 fL   MCH 27.4 26.0 - 34.0 pg   MCHC 34.7 30.0 - 36.0 g/dL   RDW 40.914.2 81.111.5 - 91.415.5 %   Platelets 328 150 - 400 K/uL  Basic metabolic panel  Result Value Ref Range   Sodium 138 135 - 145 mmol/L   Potassium 4.0 3.5 - 5.1 mmol/L   Chloride 103 101 - 111 mmol/L   CO2 25 22 - 32 mmol/L   Glucose, Bld 80 65 - 99 mg/dL   BUN 14 6 - 20 mg/dL   Creatinine, Ser 7.820.66 0.44 - 1.00 mg/dL   Calcium 9.5 8.9 - 95.610.3 mg/dL   GFR calc non Af Amer >60 >60 mL/min   GFR calc Af Amer >60 >60 mL/min   Anion gap 10 5 - 15  BNP (order ONLY if patient complains of dyspnea/SOB AND you have documented it for THIS visit)  Result Value Ref Range   B Natriuretic Peptide 8.4 0.0 - 100.0 pg/mL  D-dimer, quantitative  Result Value Ref Range    D-Dimer, Quant <0.27 0.00 - 0.48 ug/mL-FEU  I-stat troponin, ED  ([facility])  Result Value Ref Range   Troponin i, poc 0.00 0.00 - 0.08 ng/mL   Comment 3          POC Urine Pregnancy, ED (pre-menopausal females)  [facility]  Result Value Ref Range   Preg Test, Ur NEGATIVE NEGATIVE    Labs Review Labs Reviewed  CBC  BASIC METABOLIC PANEL  BRAIN NATRIURETIC PEPTIDE  D-DIMER, QUANTITATIVE  I-STAT TROPOININ, ED  POC URINE PREG, ED    Imaging Review Dg Chest 2 View  05/03/2015   CLINICAL DATA:  Chest pain starting today.  EXAM: CHEST  2 VIEW  COMPARISON:  February 08, 2013  FINDINGS: The heart size and mediastinal contours are within normal limits. There is no focal infiltrate, pulmonary  edema, or pleural effusion. The visualized skeletal structures are unremarkable.  IMPRESSION: No active cardiopulmonary disease.   Electronically Signed   By: Sherian Rein M.D.   On: 05/03/2015 22:46     EKG Interpretation   Date/Time:  Friday May 03 2015 21:58:03 EDT Ventricular Rate:  86 PR Interval:  148 QRS Duration: 88 QT Interval:  376 QTC Calculation: 449 R Axis:   68 Text Interpretation:  Sinus rhythm with marked sinus arrhythmia Otherwise  normal ECG No old tracing to compare Confirmed by WARD,  DO, KRISTEN  [phone]) on 05/03/2015 10:32:48 PM      MDM   Final diagnoses:  Chest pain, unspecified chest pain type    Medications  ondansetron (ZOFRAN-ODT) disintegrating tablet 2 mg (2 mg Oral Given 05/03/15 2328)    Filed Vitals:   05/04/15 0000 05/04/15 0015 05/04/15 0030 05/04/15 0045  BP: 126/99 111/76 121/85 118/64  Pulse: 80 69 83 87  Temp:      TempSrc:      Resp: SpO2: 100% 100% 100% 99%   EKG noted sinus rhythm with a heart rate of 86 bpm, normal EKG. I-STAT troponin negative elevation. D-dimer negative elevation. BNP negative elevation. CBC negative elevated leukocytosis. Hemoglobin 13.7, hematocrit 39.5. BMP unremarkable. Urine pregnancy negative.  Chest x-ray no active cardio pulmonary disease noted. D-dimer negative elevation, doubt PE. EKG unremarkable with negative elevated troponin - HEART score 0 - likelihood of ACS low. Lungs clear to auscultation on physical examination. Negative findings of respiratory distress. Patient is a pleasant, cooperative young female. Definitive etiology of episode of chest tightness unknown. Symptoms have resolved upon arrival to the ED. Patient stable, afebrile. Patient not septic appearing. Negative signs of respiratory distress. Discharged patient. Referred patient to [facility] wellness Center. Discussed with patient to rest and stay hydrated. Discussed with patient to avoid any physical or strenuous activity. Discussed with patient to closely monitor symptoms and if symptoms are to worsen or change to report back to the ED - strict return instructions given.  Patient agreed to plan of care, understood, all questions answered.   Raymon Mutton, PA-C 05/04/15 0113  Layla Maw Ward, DO 05/04/15 1914

## 2015-05-04 NOTE — Discharge Instructions (Signed)
Please call your doctor for a followup appointment within 24-48 hours. When you talk to your doctor please let them know that you were seen in the emergency department and have them acquire all of your records so that they can discuss the findings with you and formulate a treatment plan to fully care for your new and ongoing problems. Please follow-up with health and wellness Center Please rest and stay hydrated Please avoid any physical strenuous activity Please continue to monitor symptoms closely and if symptoms are to worsen or change (fever greater than 101, chills, sweating, nausea, vomiting, chest pain, shortness of breathe, difficulty breathing, weakness, numbness, tingling, worsening or changes to pain pattern, coughing up blood, leg swelling, dizziness, fainting, weakness in the arms, neck pain, neck stiffness, visual changes) please report back to the Emergency Department immediately.    Chest Pain (Nonspecific) It is often hard to give a diagnosis for the cause of chest pain. There is always a chance that your pain could be related to something serious, such as a heart attack or a blood clot in the lungs. You need to follow up with your doctor. HOME CARE  If antibiotic medicine was given, take it as directed by your doctor. Finish the medicine even if you start to feel better.  For the next few days, avoid activities that bring on chest pain. Continue physical activities as told by your doctor.  Do not use any tobacco products. This includes cigarettes, chewing tobacco, and e-cigarettes.  Avoid drinking alcohol.  Only take medicine as told by your doctor.  Follow your doctor's suggestions for more testing if your chest pain does not go away.  Keep all doctor visits you made. GET HELP IF:  Your chest pain does not go away, even after treatment.  You have a rash with blisters on your chest.  You have a fever. GET HELP RIGHT AWAY IF:   You have more pain or pain that spreads  to your arm, neck, jaw, back, or belly (abdomen).  You have shortness of breath.  You cough more than usual or cough up blood.  You have very bad back or belly pain.  You feel sick to your stomach (nauseous) or throw up (vomit).  You have very bad weakness.  You pass out (faint).  You have chills. This is an emergency. Do not wait to see if the problems will go away. Call your local emergency services (911 in U.S.). Do not drive yourself to the hospital. MAKE SURE YOU:   Understand these instructions.  Will watch your condition.  Will get help right away if you are not doing well or get worse. Document Released: 05/25/2008 Document Revised: 12/12/2013 Document Reviewed: 05/25/2008 [facility] Patient Information 2015 [place] [employer], [place]. This information is not intended to replace advice given to you by your health care provider. Make sure you discuss any questions you have with your health care provider.   Emergency Department Resource Guide 1) Find a Doctor and Pay Out of Pocket Although you won't have to find out who is covered by your insurance plan, it is a good idea to ask around and get recommendations. You will then need to call the office and see if the doctor you have chosen will accept you as a new patient and what types of options they offer for patients who are self-pay. Some doctors offer discounts or will set up payment plans for their patients who do not have insurance, but you will need to ask so you  aren't surprised when you get to your appointment.  2) Contact Your Local Health Department Not all health departments have doctors that can see patients for sick visits, but many do, so it is worth a call to see if yours does. If you don't know where your local health department is, you can check in your phone book. The CDC also has a tool to help you locate your state's health department, and many state websites also have listings of all of their local health  departments.  3) Find a Walk-in Clinic If your illness is not likely to be very severe or complicated, you may want to try a walk in clinic. These are popping up all over the country in pharmacies, drugstores, and shopping centers. They're usually staffed by nurse practitioners or physician assistants that have been trained to treat common illnesses and complaints. They're usually fairly quick and inexpensive. However, if you have serious medical issues or chronic medical problems, these are probably not your best option.  No Primary Care Doctor: - Call Health Connect at  [phone] - they can help you locate a primary care doctor that  accepts your insurance, provides certain services, etc. - Physician Referral Service- [phone]  Chronic Pain Problems: Organization         Address  Phone   Notes  [facility]  [phone]) [phone] Patients need to be referred by their primary care doctor.   Medication Assistance: Organization         Address  Phone   Notes  [facility] Medication [facility] [address] [place], [place] [address] [phone]) [phone] --Must be a resident of [place] -- Must have NO insurance coverage whatsoever (no Medicaid/ Medicare, etc.) -- The pt. MUST have a primary care doctor that directs their care regularly and follows them in the community   MedAssist  [phone]) [phone]   [employer]  [phone]    Agencies that provide inexpensive medical care: Organization         Address  Phone   Notes  [facility]  [phone]   [facility]    [phone]   [facility] [address] [place], [place] [address] [phone]) [phone]   [facility] [address], [place] [phone]   Planned Parenthood    [phone]   [facility]    [phone]   [facility]  [address], [place] Phone:  [phone](336)  [phone], Fax:  [phone] Hours of Operation:  9 am - 6 pm, M-F.  Also accepts Medicaid/Medicare and self-pay.  [facility]  [address], [place] Phone: [phone], Fax: [phone]) [phone]. Hours of Operation:  8:30 am - 5:30 pm, M-F.  Also accepts Medicaid and self-pay.  [facility] [place] [address], [place] Phone: [phone](336) [phone]   [facility] [address], [place], [place] [phone], Ext. [phone] Mondays & Thursdays: 7-9 AM.  First 15 patients are seen on a first come, first serve basis.    Medicaid-accepting [facility] Providers:  Organization         Address  Phone   Notes  [facility] [address], [place] [phone]) [phone] Also accepts self-pay patients.  [facility] [address] [place], [place]  [phone]  [place], [address], [place] [phone]   [place] [address] [place] [phone]   Lucianne Lei [address], [place]   [phone] Only accepts [place] Access [place] patients after they have their name applied to their card.   Self-Pay (no insurance) in [facility]:  Organization         Address  Phone   Notes  Sickle Cell Patients, [facility] [place] [phone]   [facility] [place] [phone]   [facility] [place]  [place], [place], [place] [phone]   [facility]/Dr. Osei-Bonsu  [address], [place] or [address], [place] [phone] Phone number for both [place] and [place] locations is the same.  [facility]gent Medical and [facility] [address], [place] [phone]   [facility] [address] [place] or [address] [phone]   [facility] [address], [place] [phone], phone; [phone], fax Sees patients 1st and 3rd Saturday of every month.  Must not qualify for public or private insurance (i.e. Medicaid, Medicare, [place] Health Choice, Veterans' Benefits)  Household income should be no more than 200% of the poverty level The clinic cannot treat you if you are pregnant or think you are pregnant  Sexually transmitted diseases are not treated at the clinic.    Dental Care: Organization         Address  Phone  Notes  [facility] [place] [phone] Accepts children up to age 15 who are enrolled in [place] or [place]; pregnant women with a Medicaid card; and children who have applied for Medicaid or [place] Health Choice, but were declined, whose parents can pay a reduced fee at time of service.  [facility] Department of [facility]  [address], [place] [phone] Accepts children up to age 22 who are enrolled in [place] or [place]; pregnant women with a Medicaid card; and children who have applied for Medicaid or [place] Health Choice, but were declined, whose parents can pay a reduced fee at time of service.  [place] Adult [facility] Access PROGRAM  [place] [phone] Patients are seen by appointment only. Walk-ins are not accepted. [place] will see patients 92 years of age and older. Monday - Tuesday (8am-5pm) Most Wednesdays (8:30-5pm) $30 per visit, cash only  [facility] Adult Dental Access PROGRAM  [address], [facility] [phone] Patients are seen by appointment only. Walk-ins are not accepted. [place] will see patients 90 years of age and older. One Wednesday Evening (Monthly: Volunteer Based).  $30 per visit, cash only  [place]  [phone] for adults;  Children under age 64, call Graduate Pediatric Dentistry at [phone]. Children aged 70-14, please call [phone] to request a pediatric application.  Dental services are provided in all areas of dental care including fillings, crowns and bridges, complete and partial dentures, implants, gum treatment, root canals, and extractions. Preventive care is also provided. Treatment is provided to both adults and children. Patients are selected via a lottery and there is often a waiting list.  [facility] [address] [place]  [phone] [email or web address]   [facility] [address] [place], [place] [phone], Ext. [phone] Second and Fourth Thursday of each month, opens at 6:30 AM; Clinic ends at 9 AM.  Patients are seen on a first-come first-served basis, and a limited number are seen during each clinic.   [facility]  [address] Hillard Danker [place], [place] [phone]   Eligibility Requirements You must have lived in [place], [place], or [place] counties for at least the last three months.   You cannot be eligible for state or federal sponsored [employer], including [employer], [place], or [employer].   You generally cannot be eligible for healthcare insurance through your employer.    How to apply: Eligibility screenings are held every Tuesday and Wednesday afternoon from 1:00 pm until 4:00 pm. You do not need an appointment for the interview!  [facility] [address], [place], [place]   [place]  [place] Department  Deputy  [phone]    Behavioral Health Resources in the Community: Intensive Outpatient Programs Organization         Address  Phone  Notes  Webb Dickens. [address] [place], [place] [phone]   [facility] Outpatient [address] [place], [place]   ADS: Alcohol [facility] Drug S[facility] [address] [place], [place]   [place] [address],  [place], [place] or [phone]   Substance Abuse Resources Organization         Address  Phone  Notes  Alcohol and Drug Services  [phone]   [place]  [phone]   The [place]   Chinita Pester  [phone]   Residential & Outpatient Substance Abuse Program  [phone]   Psychological Services Organization         Address  Phone  Notes  [facility]  [place]  [phone]   [place] [address], [place] or [phone]    Mobile Crisis Teams Organization         Address  Phone  Notes  [facility], Mobile Crisis Care Unit  [phone]   [facility]  [address] [place], [place]   [place] Levels [address], [place] [phone]    Self-Help/Support Groups Organization         Address  Phone             Notes  [place]. of [place] - variety of support groups  [place] Call for more information  Narcotics Anonymous (NA), [facility]aring [facility] [address], [employer] [place]  2 meetings at this location   Special educational needs teacher         Address  Phone  Notes  [facility] Residential Treatment [place],    [place]  [phone]   [facility]  [address] [place] [address], [place], [place]   [place], [place] [phone] Admissions: 8am-3pm M-F  Incentives Substance [place] [address]    [place], [place] [phone]   [facility] [address] [place], [place], [place]   The [place].,  [place], [place] [phone]   [facility] - Intensive  Outpatient [address], [place], [place] [phone]   [facility] [address]  [place], [place] [phone] or [phone]   [facility] (RTS) [address] [place], [place] [phone] Accepts Medicaid  [facility] [address]  [place] [phone] Substance Abuse/Addiction Treatment   [facility] Behavioral Health Resources Organization         Address  Phone  Notes  [facility]  [phone]) [phone]   Angie FavaJulie Brannon, PhD [address], [address] [place], [place]   [phone]) [phone] or [phone]) [phone]   [facility]   [address] [place], [place] [phone]) [phone]   [facility] [address], [place], [place] [phone]) [phone] Insurance/Medicaid/sponsorship through [facility]  [facility] Families [address]                                    [place], [place] [phone]) [phone] Therapy/tele-psych/case  [facility] [address]   [place], [place] [phone]) [phone]    Dr. Lolly MustacheArfeen  [phone]) [phone]   [facility] [place]  United Way [facility] 1) [address], [place] 2) [address], [place] 3)  [address], [place] [phone](336) [phone]) [phone]  [phone]) [phone]   [facility] Child Abuse Hotline [phone]) [phone] or [phone](336) [phone] (After Hours)

## 2015-05-04 NOTE — ED Notes (Signed)
Pt. Eating a sub

## 2015-05-04 NOTE — ED Notes (Signed)
Pt. Left with all belongings and refused wheelchair 

## 2016-02-10 ENCOUNTER — Encounter (HOSPITAL_COMMUNITY): Payer: Self-pay | Admitting: Emergency Medicine

## 2016-02-10 ENCOUNTER — Emergency Department (HOSPITAL_COMMUNITY): Payer: Self-pay

## 2016-02-10 ENCOUNTER — Emergency Department (HOSPITAL_COMMUNITY)
Admission: EM | Admit: 2016-02-10 | Discharge: 2016-02-10 | Disposition: A | Discharge status: Home / Self Care | Payer: Self-pay | Attending: Emergency Medicine | Admitting: Emergency Medicine

## 2016-02-10 DIAGNOSIS — Z79899 Other long term (current) drug therapy: Secondary | ICD-10-CM | POA: Insufficient documentation

## 2016-02-10 DIAGNOSIS — M791 Myalgia, unspecified site: Secondary | ICD-10-CM

## 2016-02-10 DIAGNOSIS — R05 Cough: Secondary | ICD-10-CM | POA: Insufficient documentation

## 2016-02-10 DIAGNOSIS — R5081 Fever presenting with conditions classified elsewhere: Secondary | ICD-10-CM | POA: Insufficient documentation

## 2016-02-10 DIAGNOSIS — Z8659 Personal history of other mental and behavioral disorders: Secondary | ICD-10-CM | POA: Insufficient documentation

## 2016-02-10 DIAGNOSIS — R059 Cough, unspecified: Secondary | ICD-10-CM

## 2016-02-10 DIAGNOSIS — R0981 Nasal congestion: Secondary | ICD-10-CM | POA: Insufficient documentation

## 2016-02-10 DIAGNOSIS — R509 Fever, unspecified: Secondary | ICD-10-CM

## 2016-02-10 DIAGNOSIS — J3489 Other specified disorders of nose and nasal sinuses: Secondary | ICD-10-CM | POA: Insufficient documentation

## 2016-02-10 DIAGNOSIS — Z3202 Encounter for pregnancy test, result negative: Secondary | ICD-10-CM | POA: Insufficient documentation

## 2016-02-10 LAB — URINE MICROSCOPIC-ADD ON

## 2016-02-10 LAB — BASIC METABOLIC PANEL
Anion gap: 8 (ref 5–15)
BUN: 10 mg/dL (ref 6–20)
CO2: 23 mmol/L (ref 22–32)
Calcium: 9 mg/dL (ref 8.9–10.3)
Chloride: 106 mmol/L (ref 101–111)
Creatinine, Ser: 0.7 mg/dL (ref 0.44–1.00)
GFR calc Af Amer: >60 mL/min (ref >60)
GFR calc non Af Amer: >60 mL/min (ref >60)
GLUCOSE: 103 mg/dL — AB (ref 65–99)
POTASSIUM: 3.7 mmol/L (ref 3.5–5.1)
Sodium: 137 mmol/L (ref 135–145)

## 2016-02-10 LAB — CBC WITH DIFFERENTIAL/PLATELET
Basophils Absolute: 0 10*3/uL (ref 0.0–0.1)
Basophils Relative: 0 %
EOS PCT: 1 %
Eosinophils Absolute: 0.1 10*3/uL (ref 0.0–0.7)
HCT: 40.4 % (ref 36.0–46.0)
Hemoglobin: 13.4 g/dL (ref 12.0–15.0)
LYMPHS ABS: 0.9 10*3/uL (ref 0.7–4.0)
LYMPHS PCT: 13 %
MCH: 27.4 pg (ref 26.0–34.0)
MCHC: 33.2 g/dL (ref 30.0–36.0)
MCV: 82.6 fL (ref 78.0–100.0)
Monocytes Absolute: 1 10*3/uL (ref 0.1–1.0)
Monocytes Relative: 14 %
Neutro Abs: 5.1 10*3/uL (ref 1.7–7.7)
Neutrophils Relative %: 72 %
Platelets: 234 10*3/uL (ref 150–400)
RBC: 4.89 MIL/uL (ref 3.87–5.11)
RDW: 14.6 % (ref 11.5–15.5)
WBC: 7.1 10*3/uL (ref 4.0–10.5)

## 2016-02-10 LAB — URINALYSIS, ROUTINE W REFLEX MICROSCOPIC
Bilirubin Urine: NEGATIVE
GLUCOSE, UA: NEGATIVE mg/dL
Ketones, ur: 15 mg/dL — AB
Nitrite: NEGATIVE
PH: 7 (ref 5.0–8.0)
Protein, ur: NEGATIVE mg/dL
Specific Gravity, Urine: 1.031 — ABNORMAL HIGH (ref 1.005–1.030)

## 2016-02-10 LAB — POC URINE PREG, ED: Preg Test, Ur: NEGATIVE

## 2016-02-10 MED ORDER — ACETAMINOPHEN 325 MG PO TABS
ORAL_TABLET | ORAL | Status: AC
  Filled 2016-02-10: qty 2

## 2016-02-10 MED ORDER — ACETAMINOPHEN 325 MG PO TABS
650.0000 mg | ORAL_TABLET | Freq: Once | ORAL | Status: AC
  Administered 2016-02-10: 650 mg via ORAL

## 2016-02-10 NOTE — ED Provider Notes (Signed)
CSN: [id number]     Arrival date & time 02/10/16  4098 History   First MD Initiated Contact with Patient 02/10/16 [phone]     Chief Complaint  Patient presents with  . Cough  . Chills  . Nasal Congestion  . Generalized Body Aches     (Consider location/radiation/quality/duration/timing/severity/associated sxs/prior Treatment) Patient is a 20 y.o. female presenting with cough. The history is provided by the patient.  Cough Cough characteristics:  Productive Sputum characteristics:  Clear Severity:  Moderate Onset quality:  Gradual Duration:  2 days Timing:  Constant Progression:  Worsening Chronicity:  New Smoker: no   Context: upper respiratory infection   Relieved by:  Nothing Worsened by:  Nothing tried Ineffective treatments:  Cough suppressants Associated symptoms: chills, fever, myalgias, rhinorrhea and sinus congestion   Associated symptoms: no chest pain, no ear pain, no headaches, no rash, no shortness of breath, no sore throat and no wheezing     Past Medical History  Diagnosis Date  . OCD (obsessive compulsive disorder)    Past Surgical History  Procedure Laterality Date  . Wrist ganglion excision Left   . Wisdom tooth extraction     Family History  Problem Relation Age of Onset  . Adopted: Yes   Social History  Substance Use Topics  . Smoking status: Never Smoker   . Smokeless tobacco: Never Used  . Alcohol Use: No   OB History    Gravida Para Term Preterm AB TAB SAB Ectopic Multiple Living       Review of Systems  Constitutional: Positive for fever and chills. Negative for appetite change and fatigue.  HENT: Positive for rhinorrhea. Negative for congestion, ear pain, facial swelling, mouth sores and sore throat.   Eyes: Negative for visual disturbance.  Respiratory: Positive for cough. Negative for chest tightness, shortness of breath and wheezing.   Cardiovascular: Negative for chest pain and palpitations.  Gastrointestinal:  Negative for nausea, vomiting, abdominal pain, diarrhea and blood in stool.  Endocrine: Negative for cold intolerance and heat intolerance.  Genitourinary: Negative for frequency, decreased urine volume and difficulty urinating.  Musculoskeletal: Positive for myalgias. Negative for back pain and neck stiffness.  Skin: Negative for rash.  Neurological: Negative for dizziness, weakness, light-headedness and headaches.  All other systems reviewed and are negative.     Allergies  Codeine  Home Medications   Prior to Admission medications   Medication Sig Start Date End Date Taking? Authorizing Provider  etonogestrel (NEXPLANON) 68 MG IMPL implant Inject 1 each into the skin once.    Historical Provider, MD  lansoprazole (PREVACID) 30 MG capsule Take 30 mg by mouth daily at 12 noon.    Historical Provider, MD  naproxen sodium (ANAPROX) 220 MG tablet Take 220 mg by mouth daily as needed (pain).     Historical Provider, MD   BP 129/80 mmHg  Pulse 96  Temp(Src) 98.7 F (37.1 C) (Oral)  Ht  (1.702 m)  Wt 71.215 kg  BMI 24.58 kg/m2  SpO2 96% Physical Exam  Constitutional: She is oriented to person, place, and time. She appears well-developed and well-nourished. No distress.  HENT:  Head: Normocephalic and atraumatic.  Right Ear: External ear normal.  Left Ear: External ear normal.  Nose: Nose normal.  Eyes: Conjunctivae and EOM are normal. Pupils are equal, round, and reactive to light. Right eye exhibits no discharge. Left eye exhibits no discharge. No scleral icterus.  Neck: Normal  range of motion. Neck supple.  Cardiovascular: Normal rate, regular rhythm and normal heart sounds.  Exam reveals no gallop and no friction rub.   No murmur heard. Pulmonary/Chest: Effort normal and breath sounds normal. No stridor. No respiratory distress. She has no wheezes.  Abdominal: Soft. She exhibits no distension. There is no tenderness.  Musculoskeletal: She exhibits no edema or tenderness.   Neurological: She is alert and oriented to person, place, and time.  Skin: Skin is warm and dry. No rash noted. She is not diaphoretic. No erythema.  Psychiatric: She has a normal mood and affect.    ED Course  Procedures (including critical care time) Labs Review Labs Reviewed  BASIC METABOLIC PANEL - Abnormal; Notable for the following:    Glucose, Bld 103 (*)    All other components within normal limits  URINALYSIS, ROUTINE W REFLEX MICROSCOPIC (NOT AT [facility]) - Abnormal; Notable for the following:    APPearance CLOUDY (*)    Specific Gravity, Urine 1.031 (*)    Hgb urine dipstick MODERATE (*)    Ketones, ur 15 (*)    Leukocytes, UA TRACE (*)    All other components within normal limits  URINE MICROSCOPIC-ADD ON - Abnormal; Notable for the following:    Squamous Epithelial / LPF 6-30 (*)    Bacteria, UA RARE (*)    All other components within normal limits  CBC WITH DIFFERENTIAL/PLATELET  POC URINE PREG, ED    Imaging Review Dg Chest 2 View  02/10/2016  CLINICAL DATA:  Acute onset of fever, chills and generalized body aches. Productive cough and diaphoresis. Initial encounter. EXAM: CHEST  2 VIEW COMPARISON:  Chest radiograph performed 05/03/2015 FINDINGS: The lungs are well-aerated and clear. There is no evidence of focal opacification, pleural effusion or pneumothorax. The heart is normal in size; the mediastinal contour is within normal limits. No acute osseous abnormalities are seen. IMPRESSION: No acute cardiopulmonary process seen. Electronically Signed   By: Roanna Raider M.D.   On: 02/10/2016 04:40   I have personally reviewed and evaluated these images and lab results as part of my medical decision-making.   EKG Interpretation None      MDM   URI sx for 2 days. No h/o asthma, DM, non-smoker. Febrile on arrival. Well appearing and hydrated. No PNA on CXR. Labs nonspecific. Likely viral/influenza. Cleared for discharge Strict return precautions. Patient follow-up  with PCP as needed.  Seen in conjunction with Dr. Clarene Duke.  Diagnosis studies interpreted by me and use to my clinical decision-making.    Final diagnoses:  Other specified fever  Cough  Rhinorrhea  Myalgia        Drema Pry, MD 02/10/16 1634  Laurence Spates, MD 02/13/16 [phone]

## 2016-02-10 NOTE — ED Notes (Signed)
Pt. reports cough , nasal congestion , chills , runny nose and fatigue onset yesterday .

## 2016-02-10 NOTE — ED Notes (Signed)
Pt ambulated to room 

## 2016-02-10 NOTE — Discharge Instructions (Signed)

## 2016-04-24 ENCOUNTER — Encounter (HOSPITAL_COMMUNITY): Payer: Self-pay | Admitting: *Deleted

## 2016-04-24 ENCOUNTER — Emergency Department (HOSPITAL_COMMUNITY)
Admission: EM | Admit: 2016-04-24 | Discharge: 2016-04-24 | Disposition: A | Discharge status: Home / Self Care | Payer: BC Managed Care – PPO | Attending: Emergency Medicine | Admitting: Emergency Medicine

## 2016-04-24 DIAGNOSIS — F429 Obsessive-compulsive disorder, unspecified: Secondary | ICD-10-CM | POA: Diagnosis not present

## 2016-04-24 DIAGNOSIS — F419 Anxiety disorder, unspecified: Secondary | ICD-10-CM | POA: Diagnosis not present

## 2016-04-24 DIAGNOSIS — R110 Nausea: Secondary | ICD-10-CM | POA: Insufficient documentation

## 2016-04-24 DIAGNOSIS — F329 Major depressive disorder, single episode, unspecified: Secondary | ICD-10-CM | POA: Insufficient documentation

## 2016-04-24 DIAGNOSIS — Z79899 Other long term (current) drug therapy: Secondary | ICD-10-CM | POA: Diagnosis not present

## 2016-04-24 DIAGNOSIS — F32A Depression, unspecified: Secondary | ICD-10-CM

## 2016-04-24 MED ORDER — SERTRALINE HCL 50 MG PO TABS: 50.0000 mg | ORAL_TABLET | Freq: Every day | ORAL | Status: DC

## 2016-04-24 NOTE — ED Provider Notes (Signed)
CSN: [id number]     Arrival date & time 04/24/16  [id number] History  By signing my name below, I, Emmanuella Mensah, attest that this documentation has been prepared under the direction and in the presence of Linwood DibblesJon Corbitt Cloke, MD. Electronically Signed: Angelene GiovanniEmmanuella Mensah, ED Scribe. 04/24/2016. 11:34 PM.    Chief Complaint  Patient presents with  . Anxiety   Patient is a 20 y.o. female presenting with anxiety. The history is provided by the patient. No language interpreter was used.  Anxiety This is a new problem. The current episode started 3 to 5 hours ago. The problem has been gradually improving. Associated symptoms include headaches. Nothing aggravates the symptoms. Nothing relieves the symptoms. She has tried nothing for the symptoms.   HPI Comments: Regina Ramos is a 20 y.o. female with a hx of OCD who presents to the Emergency Department requesting evaluation s/p an anxiety attack that occurred earlier today. She states that she has been depressed lately and there are multiple stressors at her work. She reports associated nausea and generalized HA. Pt explains that she would like to take a step back from these stressors. No alleviating factors noted. Pt has not tried any medications PTA. She adds that she does not take medication for her OCD or any anxiety in the past. She denies any SI, HI, or hallucinations.     Past Medical History  Diagnosis Date  . OCD (obsessive compulsive disorder)    Past Surgical History  Procedure Laterality Date  . Wrist ganglion excision Left   . Wisdom tooth extraction     Family History  Problem Relation Age of Onset  . Adopted: Yes   Social History  Substance Use Topics  . Smoking status: Never Smoker   . Smokeless tobacco: Never Used  . Alcohol Use: No   OB History    Gravida Para Term Preterm AB TAB SAB Ectopic Multiple Living   0 0 0 0 0 0 0 0 0 0      Review of Systems  Constitutional: Negative for fever.  Gastrointestinal: Positive for  nausea.  Neurological: Positive for headaches.  Psychiatric/Behavioral: Negative for suicidal ideas and hallucinations. The patient is nervous/anxious.       Allergies  Codeine  Home Medications   Prior to Admission medications   Medication Sig Start Date End Date Taking? Authorizing Provider  doxycycline (VIBRAMYCIN) 100 MG capsule Take 100 mg by mouth 2 (two) times daily. 10 day therapy course patient began course on 04/21/2016 04/21/16  Yes Historical Provider, MD  ibuprofen (ADVIL,MOTRIN) 200 MG tablet Take 400 mg by mouth every 6 (six) hours as needed (for pain.).   Yes Historical Provider, MD  etonogestrel (NEXPLANON) 68 MG IMPL implant Inject 1 each into the skin once.    Historical Provider, MD  sertraline (ZOLOFT) 50 MG tablet Take 1 tablet (50 mg total) by mouth daily. 04/24/16   Linwood DibblesJon Wallice Granville, MD   BP 146/101 mmHg  Pulse 80  Temp(Src) 98.4 F (36.9 C) (Oral)  Resp 21  Ht 5\' 7"  (1.702 m)  Wt 73.483 kg  BMI 25.37 kg/m2  SpO2 100% Physical Exam  Constitutional: She appears well-developed and well-nourished. No distress.  HENT:  Head: Normocephalic and atraumatic.  Right Ear: External ear normal.  Left Ear: External ear normal.  Eyes: Conjunctivae are normal. Right eye exhibits no discharge. Left eye exhibits no discharge. No scleral icterus.  Neck: Neck supple. No tracheal deviation present.  Cardiovascular: Normal rate, regular rhythm and intact distal  pulses.   Pulmonary/Chest: Effort normal and breath sounds normal. No stridor. No respiratory distress. She has no wheezes. She has no rales.  Abdominal: Soft. Bowel sounds are normal. She exhibits no distension. There is no tenderness. There is no rebound and no guarding.  Musculoskeletal: She exhibits no edema or tenderness.  Neurological: She is alert. She has normal strength. No cranial nerve deficit (no facial droop, extraocular movements intact, no slurred speech) or sensory deficit. She exhibits normal muscle tone. She  displays no seizure activity. Coordination normal.  Skin: Skin is warm and dry. No rash noted.  Psychiatric: Her behavior is normal. Judgment and thought content normal. Her affect is not angry, not labile and not inappropriate. Her speech is not rapid and/or pressured, not delayed and not tangential. Cognition and memory are normal. She exhibits a depressed mood.  Nursing note and vitals reviewed.   ED Course  Procedures (including critical care time) DIAGNOSTIC STUDIES: Oxygen Saturation is 100% on RA, normal by my interpretation.    COORDINATION OF CARE: 11:33 PM- Pt advised of plan for treatment and pt agrees. Will provide outpatient resources for pt's follow up.      MDM   Final diagnoses:  Anxiety  Depression   Pt presents with depression and anxiety with work stressors. No HI or SI.  Sx improved while waiting in the ED.  Do not think she requires hospitalization.  Pt agrees.  Will dc home with low dose zoloft, discussed referral for outpatient treatment, counseling.  Discussed new meds, return for worsening symptoms, SI.  I personally performed the services described in this documentation, which was scribed in my presence.  The recorded information has been reviewed and is accurate.    Linwood Dibbles, MD 04/24/16 [phone]

## 2016-04-24 NOTE — ED Notes (Signed)
Pt states she has had increased anxiety and feels depressed, "a little overwhelmed about how things are going" Reports family, financial,  and work stressors. Pt denies SI or HI.

## 2016-04-24 NOTE — Discharge Instructions (Signed)
Panic Attacks Panic attacks are sudden, short feelings of great fear or discomfort. You may have them for no reason when you are relaxed, when you are uneasy (anxious), or when you are sleeping.  HOME CARE  Take all your medicines as told.  Check with your doctor before starting new medicines.  Keep all doctor visits. GET HELP IF:  You are not able to take your medicines as told.  Your symptoms do not get better.  Your symptoms get worse. GET HELP RIGHT AWAY IF:  Your attacks seem different than your normal attacks.  You have thoughts about hurting yourself or others.  You take panic attack medicine and you have a side effect. MAKE SURE YOU:  Understand these instructions.  Will watch your condition.  Will get help right away if you are not doing well or get worse.  Substance Abuse Treatment Programs  Intensive Outpatient Programs [facility]     [address]      [place], [place]                   [phone]       [facility] [address] [place], [place] [phone]  [facility]     (Inpatient and outpatient)     [address]           [phone]    [facility] [phone] (Suboxone and Methadone)  [address]      [place], [place] [address]      [phone]       [address] [employer], [place] [phone]  [facility] (Outpatient/Inpatient, Chemical)    (insurance only) [phone]             [facility] (Groups & Residential) [place], [place] [phone]     [facility]     [address]     [place], [place]      [phone]       [facility] (for caregivers and family) [phone] [address] [place], [place] [phone]      Residential Treatment Programs [facility]      [address], [place], [place] [address]  [phone]       [facility] [address] [place], [place]  [address] [phone]  [facility] [place]        [phone]       [facility] [phone]  [facility] ([facility]             [address]                                         [place], [place]                                                [phone] or [phone]                               [facility] [address] [place], [address] [phone]  [facility]    [address]      [place], [place]  [phone]       [facility] [address] [place], [place] [phone]  [facility]   [address]     [place], [place] [address]     [phone]      Admissions: 8am-3pm M-F  [facility] ([facility]) [address] [place], [place] [phone]  [facility]: Residential Program [phone](90 Days)   [place], [place]      [phone] or [phone]     ADATC: [facility] [place], [place] (Walk in Hours over the weekend or by referral)  [facility] [address] [place], [place], [place] [address] [phone]  Crisis Mobile: [facility]:  [phone] (for crisis response 24 hours a day) [facility] Hotline:      [phone] Outpatient Psychiatry and Counseling  [facility]: Mobile Crisis Management 24 hours:  [phone]  [facility] sliding scale fee and walk in schedule: M-F 8am-12pm/1pm-3pm [address]  [place], [place] [address] [phone]  [facility] [address] [place], [place] [address] [phone]  [facility] ([facility] new patient walk-in appointments available Monday - Friday 8am -3pm.          [address] [place], [place] [address] [phone] or crisis line- [phone]  [facility]/ Intensive Outpatient Therapy Program [address] [place], [place] [address] [phone]  [facility]                  Crisis Services      [phone]      [address]     [place], [place] [address]                 [place] [facility]   [facility] [phone] [address] [place], [place] [address]   [facility]          [address] [place],  [place], [place] [address]       [phone]  [facility] [address] [place], [place] [address] [phone]  [facility]    [address]    [place], [place] [address]     [phone]       Andee PolesParish McKinney, MD     [address]     [place] [address]     [phone]       [facility] [address] [place] [address]  [facility]     [address]     [place], [place]      [phone]       [facility] Eulogio DitchShamsher Ahluwalia, MD [address] [place], [place] [address] [phone]  [facility]     [address]     [place], [place] [address]     [phone]       [facility] [address] [place], [place] [address] [phone] Resources for Temporary Residential Assistance/Crisis Centers  [facility]) M-F 8am-3pm   [address] [place], [place] [address]   [phone] Services include: laundry, barbering, support groups, case management, phone  & computer access, showers, AA/NA mtgs, mental health/substance abuse nurse, job skills class, disability information, VA assistance, spiritual classes, etc.   HOMELESS SHELTERS  [facility]  [facility]   [address], [place]     [phone]              [address] [address] (women and children)       [address] [place], [place] [address] [phone] [email or web address] for application and  process Application Required  [facility]   [address]    [place] [address]     [phone]                    [facility] [address] [place], [place] [address] [phone](schedule application appt.) Application Required  [facility] (women only)    [address]     [place], [place] [address]     [phone]      Intake starts 6pm daily Need valid ID, SSC, & Police report [facility] [place] [address] [place], [place] [phone] Application Required  [facility] (men only)     [address]      [place], [place]     [phone]       [facility] (Pregnant women only) [address] [place], [place] [phone]  [facility]      [address]      [place], [place] [address]     [phone]             [facility] [address] [place], [place] [phone] 90 day commitment/SA/Application process  [facility](men only)     [address]     [place], [place]     [phone]       Check-in at [facility]            [facility] [address] [place], [place] [address] [phone] Men/Women/Women and Children must be there by 7 pm  [facility] [place], [place] [phone]                 This information is not intended to replace advice given to you by your health care provider. Make sure you discuss any questions you have with your health care provider.   Document Released: 01/09/2011 Document Revised: 09/27/2013 Document Reviewed: 07/21/2013 Elsevier Interactive Patient Education 2016 Elsevier Inc. Sertraline tablets What is this medicine? SERTRALINE (SER tra leen) is used to treat depression. It may also be used to treat obsessive compulsive disorder, panic disorder, post-trauma stress, premenstrual dysphoric disorder (PMDD) or social  anxiety. This medicine may be used for other purposes; ask your health care provider or pharmacist if you have questions. What should I tell my health care provider before I take this medicine? They need to know if you have any of these conditions: -bipolar disorder or a family history of bipolar disorder -diabetes -glaucoma -heart disease -high blood pressure -history of irregular heartbeat -history of low levels of calcium, magnesium, or potassium in the blood -if you often drink alcohol -liver disease -receiving electroconvulsive therapy -seizures -suicidal thoughts, plans, or attempt; a previous suicide attempt by you or a family member -thyroid disease -an unusual or allergic reaction to sertraline, other medicines, foods, dyes, or preservatives -pregnant or trying to get pregnant -breast-feeding How should I use this medicine? Take this medicine by mouth with a glass of water. Follow the directions on the prescription label. You can take it with or without food.  Take your medicine at regular intervals. Do not take your medicine more often than directed. Do not stop taking this medicine suddenly except upon the advice of your doctor. Stopping this medicine too quickly may cause serious side effects or your condition may worsen. A special MedGuide will be given to you by the pharmacist with each prescription and refill. Be sure to read this information carefully each time. Talk to your pediatrician regarding the use of this medicine in children. While this drug may be prescribed for children as young as 7 years for selected conditions, precautions do apply. Overdosage: If you think you have taken too much of this medicine contact a poison control center or emergency room at once. NOTE: This medicine is only for you. Do not share this medicine with others. What if I miss a dose? If you miss a dose, take it as soon as you can. If it is almost time for your next dose, take only that dose.  Do not take double or extra doses. What may interact with this medicine? Do not take this medicine with any of the following medications: -certain medicines for fungal infections like fluconazole, itraconazole, ketoconazole, posaconazole, voriconazole -cisapride -disulfiram -dofetilide -linezolid -MAOIs like Carbex, Eldepryl, Marplan, Nardil, and Parnate -metronidazole -methylene blue (injected into a vein) -pimozide -thioridazine -ziprasidone This medicine may also interact with the following medications: -alcohol -aspirin and aspirin-like medicines -certain medicines for depression, anxiety, or psychotic disturbances -certain medicines for irregular heart beat like flecainide, propafenone -certain medicines for migraine headaches like almotriptan, eletriptan, frovatriptan, naratriptan, rizatriptan, sumatriptan, zolmitriptan -certain medicines for sleep -certain medicines for seizures like carbamazepine, valproic acid, phenytoin -certain medicines that treat or prevent blood clots like warfarin, enoxaparin, dalteparin -cimetidine -digoxin -diuretics -fentanyl -furazolidone -isoniazid -lithium -NSAIDs, medicines for pain and inflammation, like ibuprofen or naproxen -other medicines that prolong the QT interval (cause an abnormal heart rhythm) -procarbazine -rasagiline -supplements like St. John's wort, kava kava, valerian -tolbutamide -tramadol -tryptophan This list may not describe all possible interactions. Give your health care provider a list of all the medicines, herbs, non-prescription drugs, or dietary supplements you use. Also tell them if you smoke, drink alcohol, or use illegal drugs. Some items may interact with your medicine. What should I watch for while using this medicine? Tell your doctor if your symptoms do not get better or if they get worse. Visit your doctor or health care professional for regular checks on your progress. Because it may take several weeks  to see the full effects of this medicine, it is important to continue your treatment as prescribed by your doctor. Patients and their families should watch out for new or worsening thoughts of suicide or depression. Also watch out for sudden changes in feelings such as feeling anxious, agitated, panicky, irritable, hostile, aggressive, impulsive, severely restless, overly excited and hyperactive, or not being able to sleep. If this happens, especially at the beginning of treatment or after a change in dose, call your health care professional. Bonita Quin may get drowsy or dizzy. Do not drive, use machinery, or do anything that needs mental alertness until you know how this medicine affects you. Do not stand or sit up quickly, especially if you are an older patient. This reduces the risk of dizzy or fainting spells. Alcohol may interfere with the effect of this medicine. Avoid alcoholic drinks. Your mouth may get dry. Chewing sugarless gum or sucking hard candy, and drinking plenty of water may help. Contact your doctor if the problem does not  go away or is severe. What side effects may I notice from receiving this medicine? Side effects that you should report to your doctor or health care professional as soon as possible: -allergic reactions like skin rash, itching or hives, swelling of the face, lips, or tongue -black or bloody stools, blood in the urine or vomit -fast, irregular heartbeat -feeling faint or lightheaded, falls -hallucination, loss of contact with reality -seizures -suicidal thoughts or other mood changes -unusual bleeding or bruising -unusually weak or tired -vomiting Side effects that usually do not require medical attention (report to your doctor or health care professional if they continue or are bothersome): -change in appetite -change in sex drive or performance -diarrhea -increased sweating -indigestion, nausea -tremors This list may not describe all possible side effects. Call  your doctor for medical advice about side effects. You may report side effects to FDA at 1-800-FDA-1088. Where should I keep my medicine? Keep out of the reach of children. Store at room temperature between 15 and 30 degrees C (59 and 86 degrees F). Throw away any unused medicine after the expiration date. NOTE: This sheet is a summary. It may not cover all possible information. If you have questions about this medicine, talk to your doctor, pharmacist, or health care provider.    2016, Elsevier/Gold Standard. (2013-07-04 12:57:35)

## 2017-02-23 IMAGING — DX DG CHEST 2V
2 series · 2 of 2 positions shown · non-contrast
Comparison: February 08, 2013

CLINICAL DATA: Chest pain starting today.

EXAM:
CHEST  2 VIEW

[chest pa]
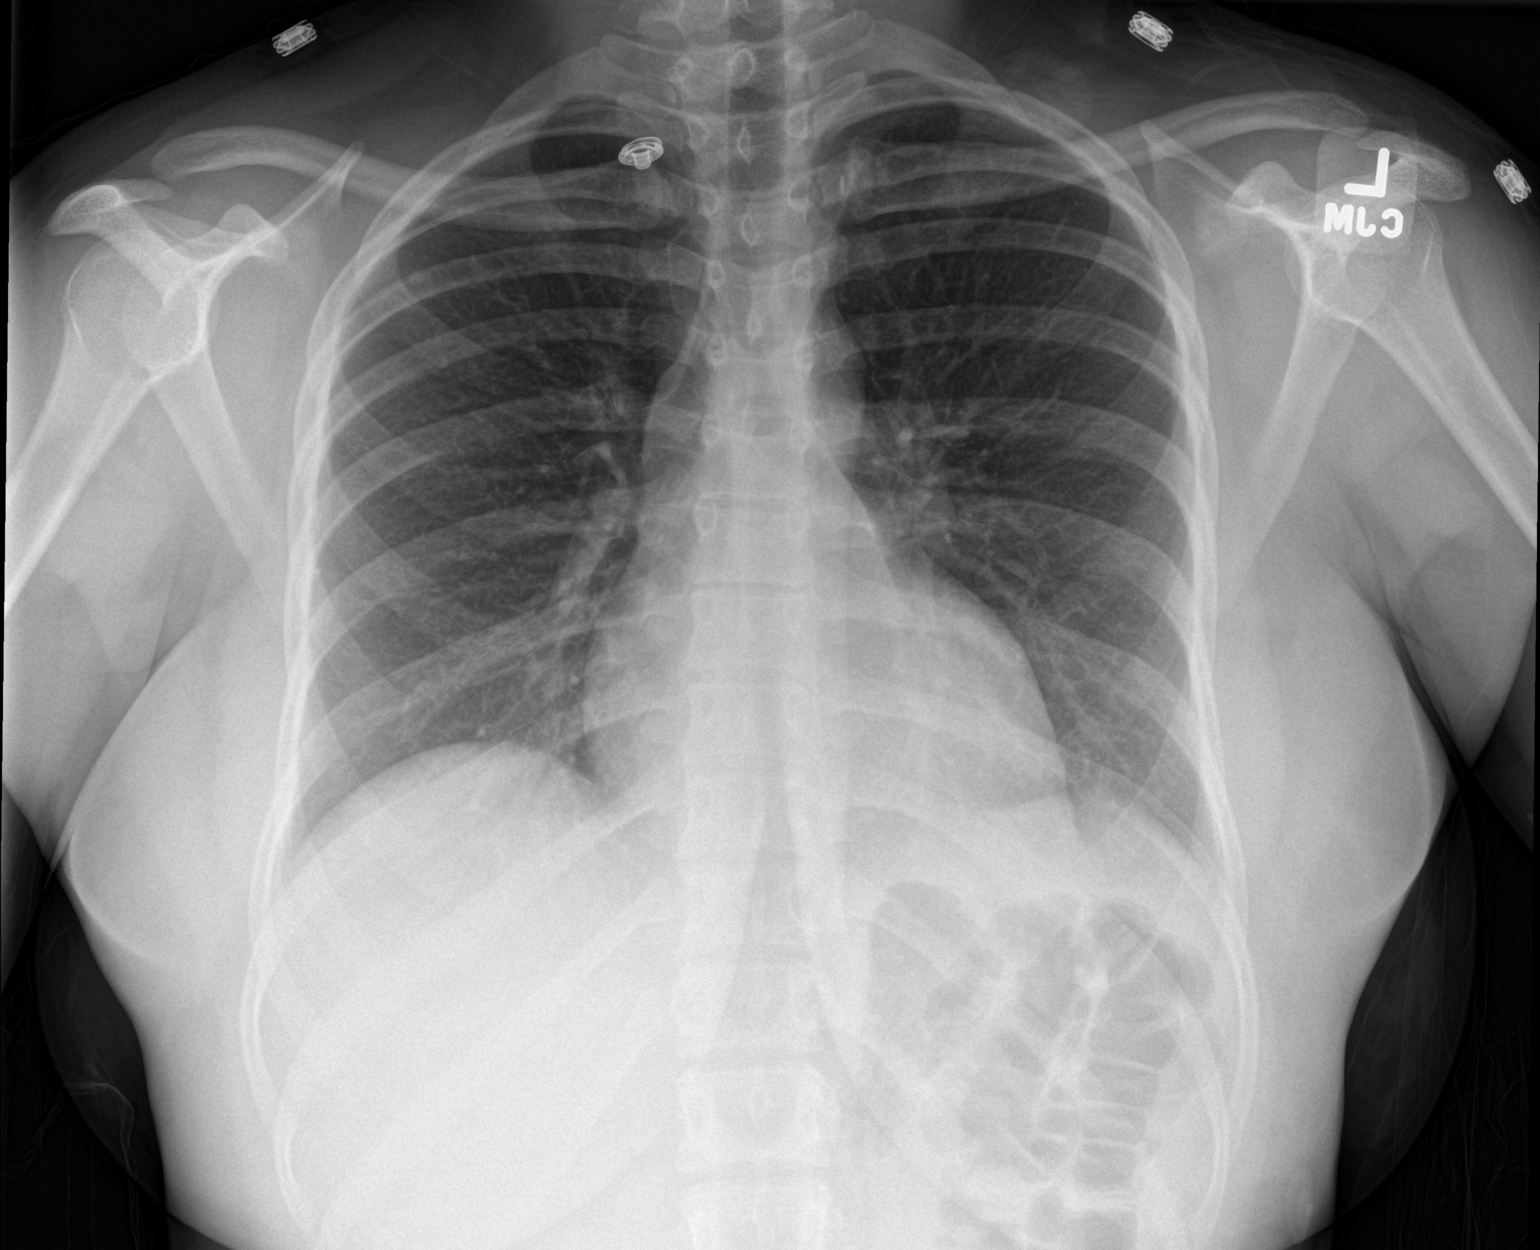

[chest lat]
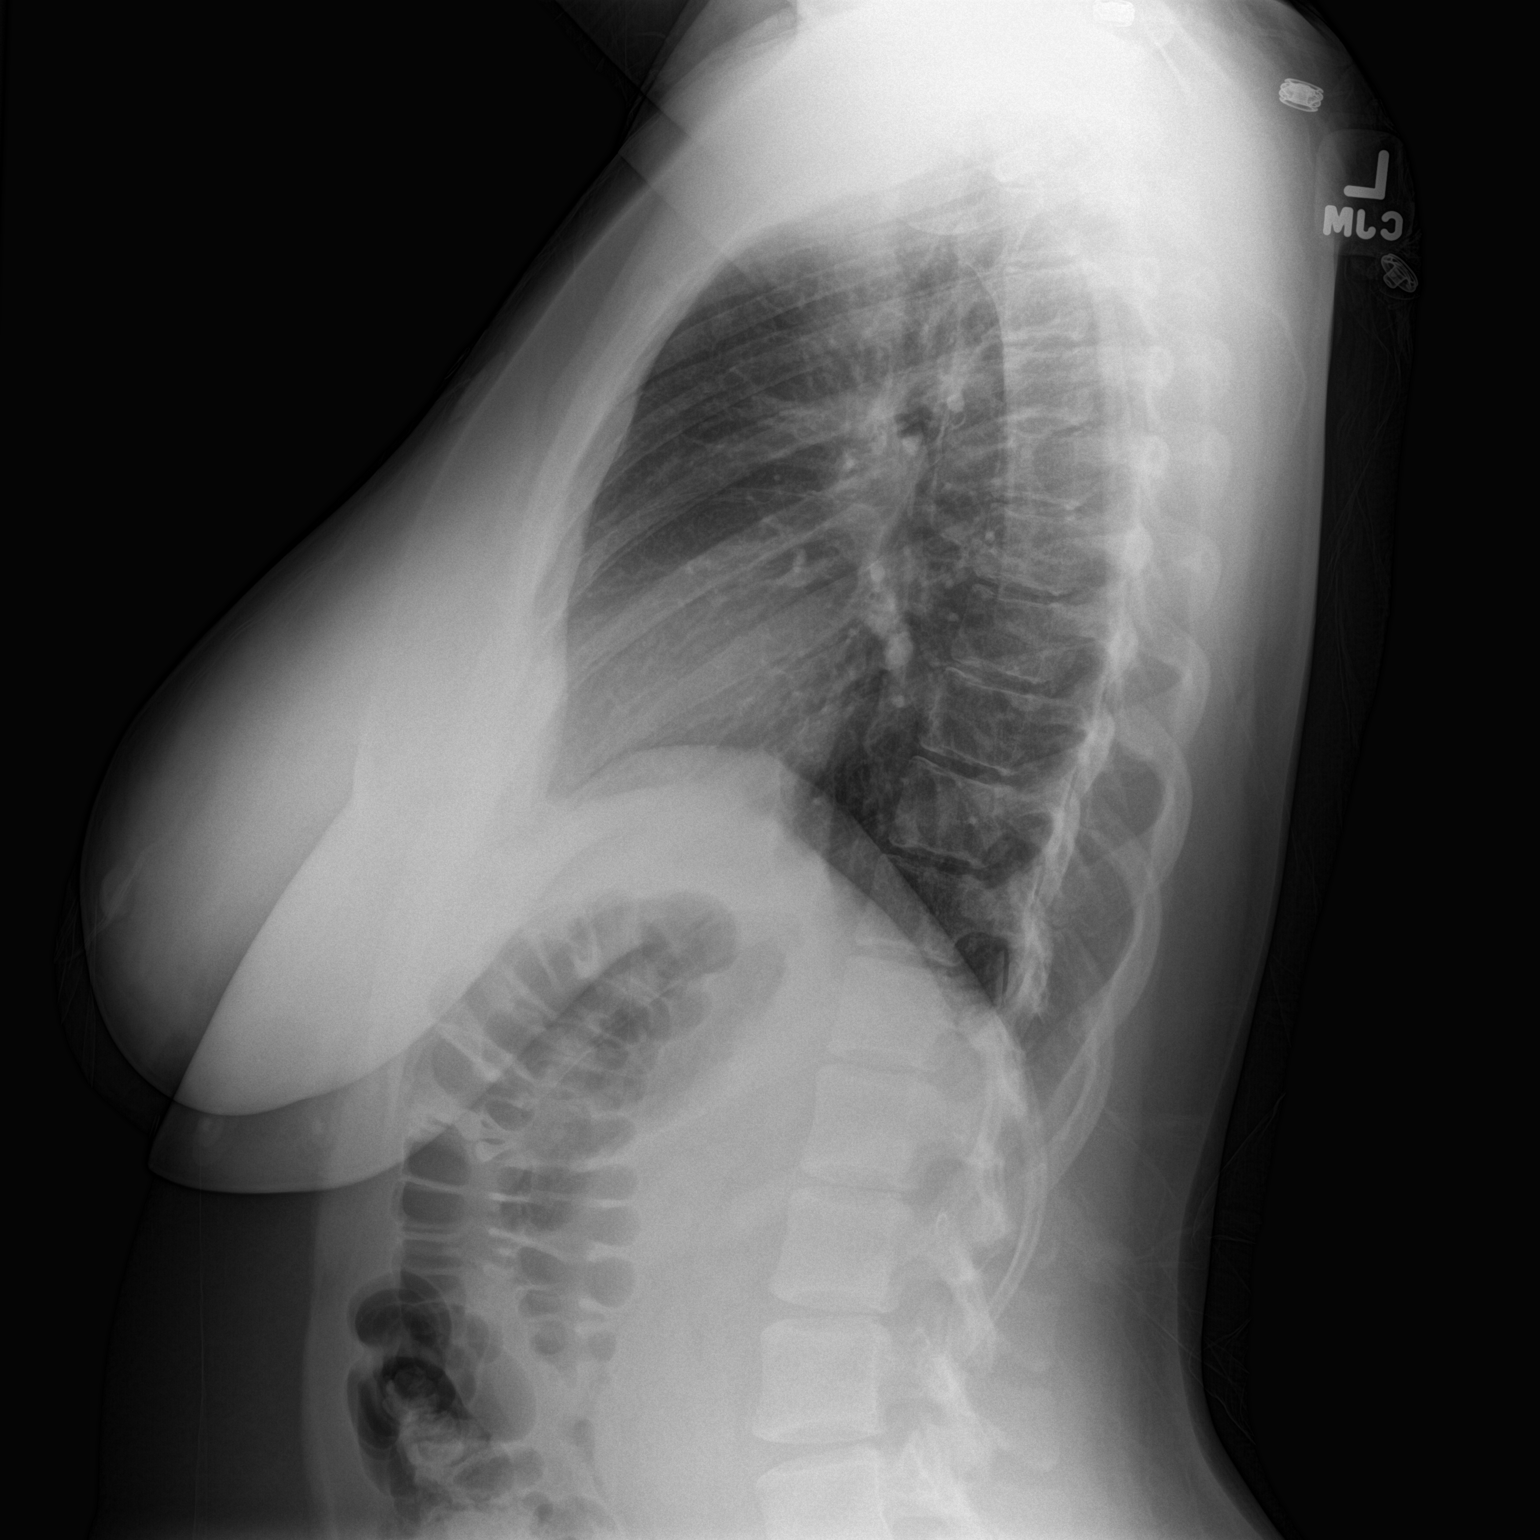

[2 of 2 positions shown; findings below may reference images not displayed]

FINDINGS: The heart size and mediastinal contours are within normal limits.
There is no focal infiltrate, pulmonary edema, or pleural effusion.
The visualized skeletal structures are unremarkable.
IMPRESSION: No active cardiopulmonary disease.

## 2018-03-07 ENCOUNTER — Other Ambulatory Visit: Payer: Self-pay | Admitting: Ophthalmology

## 2018-03-07 DIAGNOSIS — E0500 Thyrotoxicosis with diffuse goiter without thyrotoxic crisis or storm: Secondary | ICD-10-CM

## 2018-03-07 DIAGNOSIS — H4710 Unspecified papilledema: Secondary | ICD-10-CM

## 2018-03-07 DIAGNOSIS — H468 Other optic neuritis: Secondary | ICD-10-CM

## 2018-03-14 ENCOUNTER — Ambulatory Visit
Admission: RE | Admit: 2018-03-14 | Discharge: 2018-03-14 | Disposition: A | Discharge status: Home / Self Care | Payer: No Typology Code available for payment source | Source: Ambulatory Visit | Attending: Ophthalmology | Admitting: Ophthalmology

## 2018-03-14 DIAGNOSIS — H4710 Unspecified papilledema: Secondary | ICD-10-CM

## 2018-03-14 DIAGNOSIS — E0500 Thyrotoxicosis with diffuse goiter without thyrotoxic crisis or storm: Secondary | ICD-10-CM

## 2018-03-14 DIAGNOSIS — H468 Other optic neuritis: Secondary | ICD-10-CM

## 2018-03-14 MED ORDER — GADOBENATE DIMEGLUMINE 529 MG/ML IV SOLN
18.0000 mL | Freq: Once | INTRAVENOUS | Status: AC | PRN
  Administered 2018-03-14: 18 mL via INTRAVENOUS

## 2019-08-12 ENCOUNTER — Encounter (HOSPITAL_COMMUNITY): Payer: Self-pay | Admitting: *Deleted

## 2019-08-12 ENCOUNTER — Emergency Department (HOSPITAL_COMMUNITY)
Admission: EM | Admit: 2019-08-12 | Discharge: 2019-08-12 | Disposition: A | Discharge status: Home / Self Care | Payer: Self-pay | Attending: Emergency Medicine | Admitting: Emergency Medicine

## 2019-08-12 ENCOUNTER — Emergency Department (HOSPITAL_COMMUNITY): Payer: Self-pay

## 2019-08-12 ENCOUNTER — Other Ambulatory Visit: Payer: Self-pay

## 2019-08-12 DIAGNOSIS — I10 Essential (primary) hypertension: Secondary | ICD-10-CM | POA: Insufficient documentation

## 2019-08-12 DIAGNOSIS — N898 Other specified noninflammatory disorders of vagina: Secondary | ICD-10-CM | POA: Insufficient documentation

## 2019-08-12 DIAGNOSIS — B3731 Acute candidiasis of vulva and vagina: Secondary | ICD-10-CM

## 2019-08-12 DIAGNOSIS — N83209 Unspecified ovarian cyst, unspecified side: Secondary | ICD-10-CM | POA: Insufficient documentation

## 2019-08-12 DIAGNOSIS — B373 Candidiasis of vulva and vagina: Secondary | ICD-10-CM | POA: Insufficient documentation

## 2019-08-12 DIAGNOSIS — Z79899 Other long term (current) drug therapy: Secondary | ICD-10-CM | POA: Insufficient documentation

## 2019-08-12 HISTORY — DX: Essential (primary) hypertension: I10

## 2019-08-12 LAB — WET PREP, GENITAL
Clue Cells Wet Prep HPF POC: NONE SEEN
Sperm: NONE SEEN
Trich, Wet Prep: NONE SEEN

## 2019-08-12 LAB — COMPREHENSIVE METABOLIC PANEL
ALT: 12 U/L (ref 0–44)
AST: 14 U/L — ABNORMAL LOW (ref 15–41)
Albumin: 4.1 g/dL (ref 3.5–5.0)
Alkaline Phosphatase: 59 U/L (ref 38–126)
Anion gap: 8 (ref 5–15)
BUN: <5 mg/dL — ABNORMAL LOW (ref 6–20)
CO2: 23 mmol/L (ref 22–32)
Calcium: 9.5 mg/dL (ref 8.9–10.3)
Chloride: 106 mmol/L (ref 98–111)
Creatinine, Ser: 0.65 mg/dL (ref 0.44–1.00)
GFR calc Af Amer: >60 mL/min (ref >60)
GFR calc non Af Amer: >60 mL/min (ref >60)
Glucose, Bld: 115 mg/dL — ABNORMAL HIGH (ref 70–99)
Potassium: 3.8 mmol/L (ref 3.5–5.1)
Sodium: 137 mmol/L (ref 135–145)
Total Bilirubin: 0.6 mg/dL (ref 0.3–1.2)
Total Protein: 6.3 g/dL — ABNORMAL LOW (ref 6.5–8.1)

## 2019-08-12 LAB — URINALYSIS, ROUTINE W REFLEX MICROSCOPIC
Bacteria, UA: NONE SEEN
Bilirubin Urine: NEGATIVE
Glucose, UA: NEGATIVE mg/dL
Ketones, ur: NEGATIVE mg/dL
Leukocytes,Ua: NEGATIVE
Nitrite: NEGATIVE
Protein, ur: NEGATIVE mg/dL
Specific Gravity, Urine: 1.016 (ref 1.005–1.030)
pH: 6 (ref 5.0–8.0)

## 2019-08-12 LAB — I-STAT BETA HCG BLOOD, ED (MC, WL, AP ONLY): I-stat hCG, quantitative: <5 m[IU]/mL (ref <5)

## 2019-08-12 LAB — CBC
HCT: 42.6 % (ref 36.0–46.0)
Hemoglobin: 14 g/dL (ref 12.0–15.0)
MCH: 27.2 pg (ref 26.0–34.0)
MCHC: 32.9 g/dL (ref 30.0–36.0)
MCV: 82.7 fL (ref 80.0–100.0)
Platelets: 281 10*3/uL (ref 150–400)
RBC: 5.15 MIL/uL — ABNORMAL HIGH (ref 3.87–5.11)
RDW: 14.5 % (ref 11.5–15.5)
WBC: 6.8 10*3/uL (ref 4.0–10.5)
nRBC: 0 % (ref 0.0–0.2)

## 2019-08-12 LAB — LIPASE, BLOOD: Lipase: 25 U/L (ref 11–51)

## 2019-08-12 MED ORDER — FLUCONAZOLE 150 MG PO TABS: ORAL_TABLET | ORAL | 0 refills | Status: DC

## 2019-08-12 MED ORDER — IOHEXOL 300 MG/ML  SOLN
100.0000 mL | Freq: Once | INTRAMUSCULAR | Status: AC | PRN
  Administered 2019-08-12: 10:00:00 100 mL via INTRAVENOUS

## 2019-08-12 MED ORDER — POLYETHYLENE GLYCOL 3350 17 G PO PACK: 17.0000 g | PACK | Freq: Every day | ORAL | 0 refills | Status: AC | PRN

## 2019-08-12 MED ORDER — SODIUM CHLORIDE 0.9 % IV BOLUS
1000.0000 mL | Freq: Once | INTRAVENOUS | Status: AC
  Administered 2019-08-12: 09:00:00 1000 mL via INTRAVENOUS

## 2019-08-12 MED ORDER — KETOROLAC TROMETHAMINE 15 MG/ML IJ SOLN
15.0000 mg | Freq: Once | INTRAMUSCULAR | Status: AC
Start: 2019-08-12 — End: 2019-08-12
  Administered 2019-08-12: 15:00:00 15 mg via INTRAVENOUS
  Filled 2019-08-12: qty 1

## 2019-08-12 MED ORDER — NAPROXEN 500 MG PO TABS: 500.0000 mg | ORAL_TABLET | Freq: Two times a day (BID) | ORAL | 0 refills | Status: AC

## 2019-08-12 MED ORDER — POLYETHYLENE GLYCOL 3350 17 G PO PACK: 17.0000 g | PACK | Freq: Every day | ORAL | Status: DC | PRN

## 2019-08-12 NOTE — ED Notes (Signed)
Patient transported to CT 

## 2019-08-12 NOTE — ED Triage Notes (Signed)
The pt is c/o abd pain since Monday nausea vomiting with abd bloating   Unknown when last bm  She has had small abnormal bms for several days

## 2019-08-12 NOTE — ED Provider Notes (Signed)
[facility] EMERGENCY DEPARTMENT Provider Note   CSN: [id number] Arrival date & time: 08/12/19  0221     History   Chief Complaint Chief Complaint  Patient presents with  . Abdominal Pain    HPI Regina Ramos is a 23 y.o. female with a hx of HTN & OCD who presents to the ED w/ complaints of abdominal pain that has progressively worsened x 5 days. Patient reports pain started in the generalized abdomen & has now localized to the RLQ. States pain is cramping and sharp in nature. It is worse if she coughs/moves/laughs, no alleviating factors. Waxing/waning in severity, somewhat milder currently in comparison to PTA. Associated nausea as well as constipation, last BM 1 week ago, used an enema 2 days ago w/ a BM, but this did not change her pain much. Has had some vaginal discharge which she has had in the past. Sexually active w/ 1 partner without concern for STD. Denies fever, chills, emesis, melena, diarrhea, dysuria, or vaginal bleeding. LMP beginning of this month.      HPI  Past Medical History:  Diagnosis Date  . Hypertension   . OCD (obsessive compulsive disorder)     Patient Active Problem List   Diagnosis Date Noted  . Candidiasis of vulva and vagina 07/13/2013  . Dysuria 07/12/2013  . Contraception management 07/12/2013  . Vaginitis and vulvovaginitis, unspecified 07/12/2013    Past Surgical History:  Procedure Laterality Date  . WISDOM TOOTH EXTRACTION    . WRIST GANGLION EXCISION Left      OB History    Gravida  0   Para  0   Term  0   Preterm  0   AB  0   Living  0     SAB  0   TAB  0   Ectopic  0   Multiple  0   Live Births               Home Medications    Prior to Admission medications   Medication Sig Start Date End Date Taking? Authorizing Provider  doxycycline (VIBRAMYCIN) 100 MG capsule Take 100 mg by mouth 2 (two) times daily. 10 day therapy course patient began course on 04/21/2016 04/21/16   [provider]  etonogestrel (NEXPLANON) 68 MG IMPL implant Inject 1 each into the skin once.    [provider]  ibuprofen (ADVIL,MOTRIN) 200 MG tablet Take 400 mg by mouth every 6 (six) hours as needed (for pain.).    [provider]  sertraline (ZOLOFT) 50 MG tablet Take 1 tablet (50 mg total) by mouth daily. 04/24/16   Linwood Dibbles, MD    Family History Family History  Adopted: Yes    Social History Social History   Tobacco Use  . Smoking status: Never Smoker  . Smokeless tobacco: Never Used  Substance Use Topics  . Alcohol use: No  . Drug use: No     Allergies   Codeine   Review of Systems Review of Systems  Constitutional: Negative for chills and fever.  Respiratory: Negative for shortness of breath.   Cardiovascular: Negative for chest pain.  Gastrointestinal: Positive for abdominal pain, constipation and nausea. Negative for anal bleeding, blood in stool, diarrhea and vomiting.  Genitourinary: Positive for vaginal discharge. Negative for dysuria and vaginal bleeding.  All other systems reviewed and are negative.    Physical Exam Updated Vital Signs BP 115/68 (BP Location: Left Arm)   Pulse 87  Temp 98.5 F (36.9 C) (Oral)   Resp 18   Ht 5\' 7"  (1.702 m)   Wt 84.4 kg   LMP 07/25/2019   SpO2 100%   BMI 29.13 kg/m   Physical Exam Vitals signs and nursing note reviewed. Exam conducted with a chaperone present.  Constitutional:      General: She is not in acute distress.    Appearance: She is well-developed. She is not toxic-appearing.  HENT:     Head: Normocephalic and atraumatic.  Eyes:     General:        Right eye: No discharge.        Left eye: No discharge.     Conjunctiva/sclera: Conjunctivae normal.  Neck:     Musculoskeletal: Neck supple.  Cardiovascular:     Rate and Rhythm: Normal rate and regular rhythm.  Pulmonary:     Effort: Pulmonary effort is normal. No respiratory distress.     Breath sounds: Normal breath  sounds. No wheezing, rhonchi or rales.  Abdominal:     General: There is no distension.     Palpations: Abdomen is soft.     Tenderness: There is abdominal tenderness (mild) in the right lower quadrant. There is no right CVA tenderness, left CVA tenderness, guarding or rebound. Negative signs include Murphy's sign, Rovsing's sign, psoas sign and obturator sign.  Genitourinary:    Labia:        Right: No lesion.        Left: No lesion.      Vagina: Vaginal discharge (mild white discharge present) present. No bleeding.     Cervix: No cervical motion tenderness.     Adnexa:        Right: No mass, tenderness or fullness.         Left: No mass, tenderness or fullness.    Skin:    General: Skin is warm and dry.     Findings: No rash.  Neurological:     Mental Status: She is alert.     Comments: Clear speech.   Psychiatric:        Behavior: Behavior normal.    ED Treatments / Results  Labs (all labs ordered are listed, but only abnormal results are displayed) Labs Reviewed  WET PREP, GENITAL - Abnormal; Notable for the following components:      Result Value   Yeast Wet Prep HPF POC PRESENT (*)    WBC, Wet Prep HPF POC FEW (*)    All other components within normal limits  COMPREHENSIVE METABOLIC PANEL - Abnormal; Notable for the following components:   Glucose, Bld 115 (*)    BUN <5 (*)    Total Protein 6.3 (*)    AST 14 (*)    All other components within normal limits  CBC - Abnormal; Notable for the following components:   RBC 5.15 (*)    All other components within normal limits  URINALYSIS, ROUTINE W REFLEX MICROSCOPIC - Abnormal; Notable for the following components:   APPearance HAZY (*)    Hgb urine dipstick SMALL (*)    All other components within normal limits  LIPASE, BLOOD  I-STAT BETA HCG BLOOD, ED ([facility], [facility], [facility] ONLY)  GC/CHLAMYDIA PROBE AMP ([place]) NOT AT [facility]    EKG None  Radiology US Transvaginal Non-ob  Result Date: 08/12/2019 CLINICAL DATA:   Evaluate right adnexal cyst. EXAM: TRANSABDOMINAL AND TRANSVAGINAL ULTRASOUND OF PELVIS DOPPLER ULTRASOUND OF OVARIES TECHNIQUE: Both transabdominal and transvaginal ultrasound examinations of the  pelvis were performed. Transabdominal technique was performed for global imaging of the pelvis including uterus, ovaries, adnexal regions, and pelvic cul-de-sac. It was necessary to proceed with endovaginal exam following the transabdominal exam to visualize the adnexal structures. Color and duplex Doppler ultrasound was utilized to evaluate blood flow to the ovaries. COMPARISON:  CT abdomen pelvis 08/12/2019 FINDINGS: Uterus Measurements: 7.8 x 4.8 x 3.3 cm = volume: 64.6 mL. No fibroids or other mass visualized. Endometrium Thickness: 11 mm.  No focal abnormality visualized. Right ovary Measurements: 5.1 x 5.3 x 3.5 cm = volume: 99.5 mL. There is a 4.6 x 3.6 x 4.3 cm hemorrhagic cyst within the right ovary. Left ovary Measurements: 3.3 x 2.0 x 1.9 cm = volume: 6.5 mL. Normal appearance/no adnexal mass. Pulsed Doppler evaluation of both ovaries demonstrates normal low-resistance arterial and venous waveforms. Other findings Trace fluid in the pelvis. IMPRESSION: The right ovary is enlarged and contains a 4.6 cm hemorrhagic cyst. Arterial and venous waveforms are demonstrated within the surrounding ovarian tissue without sonographic evidence to suggest acute torsion. Given the size of the ovary, intermittent torsion as a cause of pain cannot be entirely excluded although vascularity is demonstrated on current exam. Recommend follow-up pelvic ultrasound in 6-8 weeks to ensure resolution of suspected right ovarian hemorrhagic cyst. Electronically Signed   By: Annia Beltrew  Davis M.D.   On: 08/12/2019 11:45   [place] Pelvis Complete  Result Date: 08/12/2019 CLINICAL DATA:  Evaluate right adnexal cyst. EXAM: TRANSABDOMINAL AND TRANSVAGINAL ULTRASOUND OF PELVIS DOPPLER ULTRASOUND OF OVARIES TECHNIQUE: Both transabdominal and  transvaginal ultrasound examinations of the pelvis were performed. Transabdominal technique was performed for global imaging of the pelvis including uterus, ovaries, adnexal regions, and pelvic cul-de-sac. It was necessary to proceed with endovaginal exam following the transabdominal exam to visualize the adnexal structures. Color and duplex Doppler ultrasound was utilized to evaluate blood flow to the ovaries. COMPARISON:  CT abdomen pelvis 08/12/2019 FINDINGS: Uterus Measurements: 7.8 x 4.8 x 3.3 cm = volume: 64.6 mL. No fibroids or other mass visualized. Endometrium Thickness: 11 mm.  No focal abnormality visualized. Right ovary Measurements: 5.1 x 5.3 x 3.5 cm = volume: 99.5 mL. There is a 4.6 x 3.6 x 4.3 cm hemorrhagic cyst within the right ovary. Left ovary Measurements: 3.3 x 2.0 x 1.9 cm = volume: 6.5 mL. Normal appearance/no adnexal mass. Pulsed Doppler evaluation of both ovaries demonstrates normal low-resistance arterial and venous waveforms. Other findings Trace fluid in the pelvis. IMPRESSION: The right ovary is enlarged and contains a 4.6 cm hemorrhagic cyst. Arterial and venous waveforms are demonstrated within the surrounding ovarian tissue without sonographic evidence to suggest acute torsion. Given the size of the ovary, intermittent torsion as a cause of pain cannot be entirely excluded although vascularity is demonstrated on current exam. Recommend follow-up pelvic ultrasound in 6-8 weeks to ensure resolution of suspected right ovarian hemorrhagic cyst. Electronically Signed   By: Annia Beltrew  Davis M.D.   On: 08/12/2019 11:45   Ct Abdomen Pelvis W Contrast  Result Date: 08/12/2019 CLINICAL DATA:  Abdominal pain, nausea/vomiting, abdominal bloating EXAM: CT ABDOMEN AND PELVIS WITH CONTRAST TECHNIQUE: Multidetector CT imaging of the abdomen and pelvis was performed using the standard protocol following bolus administration of intravenous contrast. CONTRAST:  100mL OMNIPAQUE IOHEXOL 300 MG/ML  SOLN  COMPARISON:  None. FINDINGS: Lower chest: Lung bases are clear. Hepatobiliary: Liver is within normal limits, noting focal fat/altered perfusion along the falciform ligament. Gallbladder is unremarkable. No intrahepatic or extrahepatic ductal dilatation. Pancreas:  Within normal limits. Spleen: Within normal limits. Adrenals/Urinary Tract: Adrenal glands are within normal limits. Kidneys are within normal limits.  No hydronephrosis. Bladder is mildly thick-walled although underdistended. Stomach/Bowel: Stomach is within normal limits. No evidence of bowel obstruction. Normal appendix (series 3/image 62). Vascular/Lymphatic: No evidence of abdominal aortic aneurysm. No suspicious abdominopelvic lymphadenopathy. Reproductive: Uterus is within normal limits. Bilateral ovaries are unremarkable. However, there is a 4.4 x 4.2 cm mildly thick-walled right para ovarian cystic lesion with suspected layering hemorrhage in the right pelvic cul-de-sac adjacent to the right ovary (series 3/image 70). Other: No abdominopelvic ascites. Musculoskeletal: Visualized osseous structures are within normal limits. IMPRESSION: 4.4 cm right paraovarian cystic lesion with suspected layering hemorrhage in the right pelvic cul-de-sac adjacent to the right ovary, possibly reflecting a hemorrhagic cyst versus endometrioma. Consider pelvic ultrasound for further characterization. In the setting of pelvic pain, Doppler evaluation may be beneficial. No evidence of bowel obstruction.  Normal appendix. Otherwise unremarkable CT abdomen/pelvis. Electronically Signed   By: Charline BillsSriyesh  Krishnan M.D.   On: 08/12/2019 10:28   [place] Art/ven Flow Abd Pelv Doppler  Result Date: 08/12/2019 CLINICAL DATA:  Evaluate right adnexal cyst. EXAM: TRANSABDOMINAL AND TRANSVAGINAL ULTRASOUND OF PELVIS DOPPLER ULTRASOUND OF OVARIES TECHNIQUE: Both transabdominal and transvaginal ultrasound examinations of the pelvis were performed. Transabdominal technique was performed  for global imaging of the pelvis including uterus, ovaries, adnexal regions, and pelvic cul-de-sac. It was necessary to proceed with endovaginal exam following the transabdominal exam to visualize the adnexal structures. Color and duplex Doppler ultrasound was utilized to evaluate blood flow to the ovaries. COMPARISON:  CT abdomen pelvis 08/12/2019 FINDINGS: Uterus Measurements: 7.8 x 4.8 x 3.3 cm = volume: 64.6 mL. No fibroids or other mass visualized. Endometrium Thickness: 11 mm.  No focal abnormality visualized. Right ovary Measurements: 5.1 x 5.3 x 3.5 cm = volume: 99.5 mL. There is a 4.6 x 3.6 x 4.3 cm hemorrhagic cyst within the right ovary. Left ovary Measurements: 3.3 x 2.0 x 1.9 cm = volume: 6.5 mL. Normal appearance/no adnexal mass. Pulsed Doppler evaluation of both ovaries demonstrates normal low-resistance arterial and venous waveforms. Other findings Trace fluid in the pelvis. IMPRESSION: The right ovary is enlarged and contains a 4.6 cm hemorrhagic cyst. Arterial and venous waveforms are demonstrated within the surrounding ovarian tissue without sonographic evidence to suggest acute torsion. Given the size of the ovary, intermittent torsion as a cause of pain cannot be entirely excluded although vascularity is demonstrated on current exam. Recommend follow-up pelvic ultrasound in 6-8 weeks to ensure resolution of suspected right ovarian hemorrhagic cyst. Electronically Signed   By: Annia Beltrew  Davis M.D.   On: 08/12/2019 11:45    Procedures Procedures (including critical care time)  Medications Ordered in ED Medications  ketorolac (TORADOL) 15 MG/ML injection 15 mg (has no administration in time range)  sodium chloride 0.9 % bolus 1,000 mL (1,000 mLs Intravenous New Bag/Given 08/12/19 0907)  iohexol (OMNIPAQUE) 300 MG/ML solution 100 mL (100 mLs Intravenous Contrast Given 08/12/19 0944)    Initial Impression / Assessment and Plan / ED Course  I have reviewed the triage vital signs and the  nursing notes.  Pertinent labs & imaging results that were available during my care of the patient were reviewed by me and considered in my medical decision making (see chart for details).    Patient presents to the ED with complaints of abdominal pain. Patient nontoxic appearing, in no apparent distress, vitals WNL- initially elevated BP completely normalized. On exam  patient tender to RLQ midly, no peritoneal signs. Pelvic exam w/o adnexal/cervical motion tenderness to suggest PID.   ER work-up reviewed:  CBC: No leukocytosis or anemia.  CMP: No significant electrolyte derangement. No significant abnormalities in renal/liver function.  Lipase: WNL UA: Hematuria- no UTI, PCP recheck.  Preg test: Negative- doubt ectopic.   Wet prep: Yeast- likely cause of discharge- tx w/ diflucan  CT A/P obtained: 4.4 cm right paraovarian cystic lesion with suspected layering hemorrhage in the right pelvic cul-de-sac adjacent to the right ovary, possibly reflecting a hemorrhagic cyst versus endometrioma. Consider pelvic ultrasound for further characterization. In the setting of pelvic pain, Doppler evaluation may be beneficial. No evidence of bowel obstruction.  Normal appendix. Otherwise unremarkable CT abdomen/pelvis.  US subsequently ordered: The right ovary is enlarged and contains a 4.6 cm hemorrhagic cyst. Arterial and venous waveforms are demonstrated within the surrounding ovarian tissue without sonographic evidence to suggest acute torsion. Given the size of the ovary, intermittent torsion as a cause of pain cannot be entirely excluded although vascularity is demonstrated on current exam. Recommend follow-up pelvic ultrasound in 6-8 weeks to ensure resolution of suspected right ovarian hemorrhagic cyst.  Large hemorrhagic ovarian cyst with ovarian enlargement to R side; likely cause of pain, no current torsion. Discussed w/ Dr. Rosalia Hammersay- recommends discharge home with close obgyn f/u & very strict return  precautions given no evidence of torsion currently, I am in agreement. Will DC home with Naproxen. Will also provide miralax for constipation PRN. I discussed results, treatment plan, need for follow-up, and return precautions with the patient & her boyfriend @ bedside. Provided opportunity for questions, patient & her boyfriend confirmed understanding and are in agreement with plan.   Final Clinical Impressions(s) / ED Diagnoses   Final diagnoses:  Hemorrhagic ovarian cyst  Yeast vaginitis    ED Discharge Orders         Ordered    naproxen (NAPROSYN) 500 MG tablet  2 times daily     08/12/19 1417    fluconazole (DIFLUCAN) 150 MG tablet     08/12/19 1453    polyethylene glycol (MIRALAX) 17 g packet  Daily PRN     08/12/19 1455           Clorene Nerio, ColfaxSamantha R, PA-C 08/12/19 1457    Margarita Grizzleay, Danielle, MD 08/12/19 [phone]

## 2019-08-12 NOTE — Discharge Instructions (Addendum)
You were seen in the ER today for abdominal pain.  Your imaging revealed that you have a hemorrhagic ovarian cyst that is 4.6 cm, your right ovary is also somewhat enlarged with the cyst.  We are sending you home with naproxen to help with the pain.  - Naproxen is a nonsteroidal anti-inflammatory medication that will help with pain and swelling. Be sure to take this medication as prescribed with food, 1 pill every 12 hours,  It should be taken with food, as it can cause stomach upset, and more seriously, stomach bleeding. Do not take other nonsteroidal anti-inflammatory medications with this such as Advil, Motrin, Aleve, Mobic, Goodie Powder, or Motrin.    We are also sending you home with MiraLAX to help with bowel movements and constipation, you may take this daily as needed.  Your wet prep showed a yeast infection which we are treating this with diflucan- take once today, repeat in 72 hours if you still have vaginal discharge.   We have prescribed you new medication(s) today. Discuss the medications prescribed today with your pharmacist as they can have adverse effects and interactions with your other medicines including over the counter and prescribed medications. Seek medical evaluation if you start to experience new or abnormal symptoms after taking one of these medicines, seek care immediately if you start to experience difficulty breathing, feeling of your throat closing, facial swelling, or rash as these could be indications of a more serious allergic reaction  We would like you to follow-up closely with OB/GYN for reevaluation and further management of your cyst.  Please call on Monday to schedule a close follow-up appointment and eventually a repeat ultrasound.  As discussed the main concern would be if there is a problem with blood flow to the ovary, therefore it is extremely important that should you have a change or worsening in your symptoms you return.  If you experience new or worsening  symptoms including but not limited to increased pain, sudden onset change in pain, persistent severe pain, change in quality of pain, vomiting, passing out, or any other concerns you need to return to the emergency department immediately.  You may return to this emergency department, however we recommend you go to the [facility] you to see a gynecologist in the women's ER.

## 2019-08-15 LAB — GC/CHLAMYDIA PROBE AMP (~~LOC~~) NOT AT ARMC
Chlamydia: NEGATIVE
Neisseria Gonorrhea: NEGATIVE

## 2019-08-17 ENCOUNTER — Telehealth: Payer: Self-pay | Admitting: Obstetrics and Gynecology

## 2019-08-17 NOTE — Telephone Encounter (Signed)
Spoke to patient about her appointment on 8/28 @ 9:35. Patient instructed to wear a face mask for the entire appointment and no visitors are allowed with her during the visit. Patient screened for covid symptoms and denied having any

## 2019-08-18 ENCOUNTER — Other Ambulatory Visit: Payer: Self-pay

## 2019-08-18 ENCOUNTER — Other Ambulatory Visit (HOSPITAL_COMMUNITY)
Admission: RE | Admit: 2019-08-18 | Discharge: 2019-08-18 | Disposition: A | Discharge status: Home / Self Care | Payer: Medicaid Other | Source: Ambulatory Visit | Attending: Obstetrics and Gynecology | Admitting: Obstetrics and Gynecology

## 2019-08-18 ENCOUNTER — Ambulatory Visit (INDEPENDENT_AMBULATORY_CARE_PROVIDER_SITE_OTHER): Payer: Medicaid Other | Admitting: Obstetrics and Gynecology

## 2019-08-18 ENCOUNTER — Encounter: Payer: Self-pay | Admitting: Obstetrics and Gynecology

## 2019-08-18 VITALS — BP 128/94 | HR 91 | Temp 98.2°F | Ht 67.5 in | Wt 187.3 lb

## 2019-08-18 DIAGNOSIS — Z23 Encounter for immunization: Secondary | ICD-10-CM | POA: Diagnosis not present

## 2019-08-18 DIAGNOSIS — N83201 Unspecified ovarian cyst, right side: Secondary | ICD-10-CM | POA: Diagnosis not present

## 2019-08-18 DIAGNOSIS — Z124 Encounter for screening for malignant neoplasm of cervix: Secondary | ICD-10-CM | POA: Diagnosis present

## 2019-08-18 DIAGNOSIS — Z7189 Other specified counseling: Secondary | ICD-10-CM | POA: Diagnosis not present

## 2019-08-18 DIAGNOSIS — N83209 Unspecified ovarian cyst, unspecified side: Secondary | ICD-10-CM | POA: Diagnosis not present

## 2019-08-18 DIAGNOSIS — Z113 Encounter for screening for infections with a predominantly sexual mode of transmission: Secondary | ICD-10-CM | POA: Diagnosis not present

## 2019-08-18 DIAGNOSIS — N83291 Other ovarian cyst, right side: Secondary | ICD-10-CM

## 2019-08-18 DIAGNOSIS — Z3009 Encounter for other general counseling and advice on contraception: Secondary | ICD-10-CM | POA: Diagnosis present

## 2019-08-18 DIAGNOSIS — Z7185 Encounter for immunization safety counseling: Secondary | ICD-10-CM

## 2019-08-18 NOTE — Assessment & Plan Note (Addendum)
Doing well 1 wk since diagnosis with minimal pain, benign abdominal exam. Discussed diagnosis in detail, importance of follow up ultrasound to rule out other rare etiologies, and continuation of conservative management. Many questions regarding fertility, exercise, diet were answered.

## 2019-08-18 NOTE — Assessment & Plan Note (Signed)
Wet prep with only yeast was treated during ED visit, Gon/Chl swabs were negative. Accepts HIV, RPR, HBV, HCV screening today.

## 2019-08-18 NOTE — Assessment & Plan Note (Signed)
Reviewed contraception methods in detail with focus on IUD. Interested in [place] as non-hormonal however reports hx of heavy and painful periods. Also discussed LNG IUD, minimal systemic absorption. She remains undecided and will cont to use condoms for time being, counseled on safe condom use.

## 2019-08-18 NOTE — Patient Instructions (Signed)
Use the following websites (and others) to help learn more about your contraception options and find the method that is right for you!  - The Centers for Disease Control (CDC) website: https://www.cdc.gov/reproductivehealth/contraception/index.htm  - [facility] website: https://www.plannedparenthood.org/learn/birth-control  - [employer]org: https://www.bedsider.org/methods   Go to bedsider.org for more information!  Contraception Choices Contraception, also called birth control, refers to methods or devices that prevent pregnancy. Hormonal methods Contraceptive implant A contraceptive implant is a thin, plastic tube that contains a hormone. It is inserted into the upper part of the arm. It can remain in place for up to 3 years. Progestin-only injections Progestin-only injections are injections of progestin, a synthetic form of the hormone progesterone. They are given every 3 months by a health care provider. Birth control pills Birth control pills are pills that contain hormones that prevent pregnancy. They must be taken once a day, preferably at the same time each day. Birth control patch The birth control patch contains hormones that prevent pregnancy. It is placed on the skin and must be changed once a week for three weeks and removed on the fourth week. A prescription is needed to use this method of contraception. Vaginal ring A vaginal ring contains hormones that prevent pregnancy. It is placed in the vagina for three weeks and removed on the fourth week. After that, the process is repeated with a new ring. A prescription is needed to use this method of contraception. Emergency contraceptive Emergency contraceptives prevent pregnancy after unprotected sex. They come in pill form and can be taken up to 5 days after sex. They work best the sooner they are taken after having sex. Most emergency contraceptives are available without a prescription. This method should not be used as  your only form of birth control. Barrier methods Female condom A female condom is a thin sheath that is worn over the penis during sex. Condoms keep sperm from going inside a woman's body. They can be used with a spermicide to increase their effectiveness. They should be disposed after a single use. Female condom A female condom is a soft, loose-fitting sheath that is put into the vagina before sex. The condom keeps sperm from going inside a woman's body. They should be disposed after a single use.  Intrauterine contraception Intrauterine device (IUD) An IUD is a T-shaped device that is put in a woman's uterus. There are two types:  Hormone IUD.This type contains progestin, a synthetic form of the hormone progesterone. This type can stay in place for 3-5 years.  Copper IUD.This type is wrapped in copper wire. It can stay in place for 10 years.  Permanent methods of contraception Female tubal ligation In this method, a woman's fallopian tubes are sealed, tied, or blocked during surgery to prevent eggs from traveling to the uterus.  Female sterilization This is a procedure to tie off the tubes that carry sperm (vasectomy). After the procedure, the man can still ejaculate fluid (semen).  Summary  Contraception, also called birth control, means methods or devices that prevent pregnancy.  Hormonal methods of contraception include implants, injections, pills, patches, vaginal rings, and emergency contraceptives.  Barrier methods of contraception can include female condoms, female condoms, diaphragms, cervical caps, sponges, and spermicides.  There are two types of IUDs (intrauterine devices). An IUD can be put in a woman's uterus to prevent pregnancy for 3-5 years.  Permanent sterilization can be done through a procedure for males, females, or both. This information is not intended to replace advice given to you   by your health care provider. Make sure you discuss any questions you have with your  health care provider. Document Released: 12/07/2005 Document Revised: 01/09/2017 Document Reviewed: 01/09/2017 Elsevier Interactive Patient Education  2018 [employer]  

## 2019-08-18 NOTE — Assessment & Plan Note (Signed)
Patient unsure if she has had pap before and none on file, collected today.

## 2019-08-18 NOTE — Progress Notes (Signed)
GYNECOLOGY OFFICE VISIT NOTE  History:   Regina Ramos is a 23 y.o. G0P0000 here today for ED follow up of hemorrhagic cyst. She denies any abnormal vaginal discharge, bleeding, pelvic pain or other concerns.    Went to ED for pain on 08/12/2019 Had CT scan which showed hemorrhagic R ovarian cyst measuring 4.6cm Conservative management and discharged  Since then having very infrequent pain Took naproxen up until yesterday, has not needed it since Denies n/v, fevers, vaginal bleeding, dizziness, lightheadedness  Not sure if she's ever had pap before  Had nexplanon removed 2 months ago, was feeling nauseous, bloated, unwell and wanted to see if it was related to hormones Has felt better since Currently using condoms for birth control Wants another method, interested in IUD but does not want today     Past Medical History:  Diagnosis Date  . Hypertension   . OCD (obsessive compulsive disorder)     Past Surgical History:  Procedure Laterality Date  . WISDOM TOOTH EXTRACTION    . WRIST GANGLION EXCISION Left     The following portions of the patient's history were reviewed and updated as appropriate: allergies, current medications, past family history, past medical history, past social history, past surgical history and problem list.   Health Maintenance:  No prior hx of pap on file   Review of Systems:  Pertinent items noted in HPI and remainder of comprehensive ROS otherwise negative.  Physical Exam:  BP (!) 128/94   Pulse 91   Temp 98.2 F (36.8 C)   Ht 5' 7.5" (1.715 m)   Wt 187 lb 4.8 oz (85 kg)   LMP 07/25/2019 (Approximate)   BMI 28.90 kg/m  CONSTITUTIONAL: Well-developed, well-nourished female in no acute distress.  HEENT:  Normocephalic, atraumatic.No scleral icterus NECK: Normal range of motion, supple, no masses noted on observation SKIN: No rash noted. Not diaphoretic. No erythema. No pallor. MUSCULOSKELETAL: Normal range of motion. No edema  noted. NEUROLOGIC: Alert and oriented to person, place, and time. Normal muscle tone coordination. No cranial nerve deficit noted. PSYCHIATRIC: Normal mood and affect. Normal behavior. Normal judgment and thought content. RESPIRATORY: Effort and breath sounds normal, no problems with respiration noted ABDOMEN: No masses noted. No other overt distention noted.  Non tender to palpation in all four quadrants.  PELVIC: Normal appearing external genitalia; normal appearing vaginal mucosa and cervix.  No abnormal discharge noted.  Normal uterine size, no other palpable masses, no uterine or adnexal tenderness.  Labs and Imaging Results for orders placed or performed during the hospital encounter of 08/12/19 (from the past 168 hour(s))  GC/Chlamydia probe amp   Collection Time: 08/12/19 12:00 AM  Result Value Ref Range   Chlamydia Negative    Neisseria gonorrhea Negative   Urinalysis, Routine w reflex microscopic   Collection Time: 08/12/19  2:46 AM  Result Value Ref Range   Color, Urine YELLOW YELLOW   APPearance HAZY (A) CLEAR   Specific Gravity, Urine 1.016 1.005 - 1.030   pH 6.0 5.0 - 8.0   Glucose, UA NEGATIVE NEGATIVE mg/dL   Hgb urine dipstick SMALL (A) NEGATIVE   Bilirubin Urine NEGATIVE NEGATIVE   Ketones, ur NEGATIVE NEGATIVE mg/dL   Protein, ur NEGATIVE NEGATIVE mg/dL   Nitrite NEGATIVE NEGATIVE   Leukocytes,Ua NEGATIVE NEGATIVE   RBC / HPF 0-5 0 - 5 RBC/hpf   WBC, UA 0-5 0 - 5 WBC/hpf   Bacteria, UA NONE SEEN NONE SEEN   Squamous Epithelial / LPF  6-10 0 - 5   Mucus PRESENT   Lipase, blood   Collection Time: 08/12/19  3:00 AM  Result Value Ref Range   Lipase 25 11 - 51 U/L  Comprehensive metabolic panel   Collection Time: 08/12/19  3:00 AM  Result Value Ref Range   Sodium 137 135 - 145 mmol/L   Potassium 3.8 3.5 - 5.1 mmol/L   Chloride 106 98 - 111 mmol/L   CO2 23 22 - 32 mmol/L   Glucose, Bld 115 (H) 70 - 99 mg/dL   BUN <5 (L) 6 - 20 mg/dL   Creatinine, Ser 8.41  0.44 - 1.00 mg/dL   Calcium 9.5 8.9 - 66.0 mg/dL   Total Protein 6.3 (L) 6.5 - 8.1 g/dL   Albumin 4.1 3.5 - 5.0 g/dL   AST 14 (L) 15 - 41 U/L   ALT 12 0 - 44 U/L   Alkaline Phosphatase 59 38 - 126 U/L   Total Bilirubin 0.6 0.3 - 1.2 mg/dL   GFR calc non Af Amer >60 >60 mL/min   GFR calc Af Amer >60 >60 mL/min   Anion gap 8 5 - 15  CBC   Collection Time: 08/12/19  3:00 AM  Result Value Ref Range   WBC 6.8 4.0 - 10.5 K/uL   RBC 5.15 (H) 3.87 - 5.11 MIL/uL   Hemoglobin 14.0 12.0 - 15.0 g/dL   HCT 63.0 16.0 - 10.9 %   MCV 82.7 80.0 - 100.0 fL   MCH 27.2 26.0 - 34.0 pg   MCHC 32.9 30.0 - 36.0 g/dL   RDW 32.3 55.7 - 32.2 %   Platelets 281 150 - 400 K/uL   nRBC 0.0 0.0 - 0.2 %  I-Stat beta hCG blood, ED   Collection Time: 08/12/19  3:06 AM  Result Value Ref Range   I-stat hCG, quantitative <5.0 <5 mIU/mL   Comment 3          Wet prep, genital   Collection Time: 08/12/19  8:50 AM   Specimen: Cervical/Vaginal swab  Result Value Ref Range   Yeast Wet Prep HPF POC PRESENT (A) NONE SEEN   Trich, Wet Prep NONE SEEN NONE SEEN   Clue Cells Wet Prep HPF POC NONE SEEN NONE SEEN   WBC, Wet Prep HPF POC FEW (A) NONE SEEN   Sperm NONE SEEN    US Transvaginal Non-ob  Result Date: 08/12/2019 CLINICAL DATA:  Evaluate right adnexal cyst. EXAM: TRANSABDOMINAL AND TRANSVAGINAL ULTRASOUND OF PELVIS DOPPLER ULTRASOUND OF OVARIES TECHNIQUE: Both transabdominal and transvaginal ultrasound examinations of the pelvis were performed. Transabdominal technique was performed for global imaging of the pelvis including uterus, ovaries, adnexal regions, and pelvic cul-de-sac. It was necessary to proceed with endovaginal exam following the transabdominal exam to visualize the adnexal structures. Color and duplex Doppler ultrasound was utilized to evaluate blood flow to the ovaries. COMPARISON:  CT abdomen pelvis 08/12/2019 FINDINGS: Uterus Measurements: 7.8 x 4.8 x 3.3 cm = volume: 64.6 mL. No fibroids or other  mass visualized. Endometrium Thickness: 11 mm.  No focal abnormality visualized. Right ovary Measurements: 5.1 x 5.3 x 3.5 cm = volume: 99.5 mL. There is a 4.6 x 3.6 x 4.3 cm hemorrhagic cyst within the right ovary. Left ovary Measurements: 3.3 x 2.0 x 1.9 cm = volume: 6.5 mL. Normal appearance/no adnexal mass. Pulsed Doppler evaluation of both ovaries demonstrates normal low-resistance arterial and venous waveforms. Other findings Trace fluid in the pelvis. IMPRESSION: The right ovary is enlarged and  contains a 4.6 cm hemorrhagic cyst. Arterial and venous waveforms are demonstrated within the surrounding ovarian tissue without sonographic evidence to suggest acute torsion. Given the size of the ovary, intermittent torsion as a cause of pain cannot be entirely excluded although vascularity is demonstrated on current exam. Recommend follow-up pelvic ultrasound in 6-8 weeks to ensure resolution of suspected right ovarian hemorrhagic cyst. Electronically Signed   By: Lovey Newcomer M.D.   On: 08/12/2019 11:45       Assessment and Plan:    Problem List Items Addressed This Visit      Endocrine   Hemorrhagic cyst of ovary - Primary    Doing well 1 wk since diagnosis with minimal pain, benign abdominal exam. Discussed diagnosis in detail, importance of follow up ultrasound to rule out other rare etiologies, and continuation of conservative management. Many questions regarding fertility, exercise, diet were answered.       Relevant Orders   US PELVIC COMPLETE WITH TRANSVAGINAL     Other   Encounter for counseling regarding contraception    Reviewed contraception methods in detail with focus on IUD. Interested in Nubieber as non-hormonal however reports hx of heavy and painful periods. Also discussed LNG IUD, minimal systemic absorption. She remains undecided and will cont to use condoms for time being, counseled on safe condom use.      HPV vaccine counseling    Accepts Gardasil today.       Relevant  Orders   HPV vaccine quadravalent 3 dose IM   Routine screening for STI (sexually transmitted infection)    Wet prep with only yeast was treated during ED visit, Gon/Chl swabs were negative. Accepts HIV, RPR, HBV, HCV screening today.       Relevant Orders   HIV antibody (with reflex)   RPR   Hepatitis B surface antigen   Hepatitis C Antibody   Screening for cervical cancer    Patient unsure if she has had pap before and none on file, collected today.       Relevant Orders   Cytology - PAP( [facility])       Routine preventative health maintenance measures emphasized. Please refer to After Visit Summary for other counseling recommendations.   Return if symptoms worsen or fail to improve, for assist w scheduling follow up ultrasound in 5 weeks, next dose of gardasil.    Total face-to-face time with patient: 20 minutes.  Over 50% of encounter was spent on counseling and coordination of care.   Augustin Coupe, [place] for [employer], [place]

## 2019-08-18 NOTE — Progress Notes (Addendum)
Pt here for follow up visit - seen @ [facility] on 8/22. Pt wants to know if the hemorrhagic cyst will affect her fertility. She also wants to know if she has limitations to her physical activity due to the cyst. Gardasil injection #1 administered and pt tolerated well. Gardasil #2 injection needed in 2 months.

## 2019-08-18 NOTE — Assessment & Plan Note (Signed)
Accepts Gardasil today.

## 2019-08-19 LAB — RPR: RPR Ser Ql: NONREACTIVE

## 2019-08-19 LAB — HEPATITIS C ANTIBODY: Hep C Virus Ab: 0.1 s/co ratio (ref 0.0–0.9)

## 2019-08-19 LAB — HEPATITIS B SURFACE ANTIGEN: Hepatitis B Surface Ag: NEGATIVE

## 2019-08-19 LAB — HIV ANTIBODY (ROUTINE TESTING W REFLEX): HIV Screen 4th Generation wRfx: NONREACTIVE

## 2019-08-21 LAB — CYTOLOGY - PAP: Diagnosis: NEGATIVE

## 2019-09-25 ENCOUNTER — Ambulatory Visit (HOSPITAL_COMMUNITY): Admission: RE | Admit: 2019-09-25 | Payer: Self-pay | Source: Ambulatory Visit

## 2019-10-18 ENCOUNTER — Ambulatory Visit: Payer: Medicaid Other

## 2020-05-27 ENCOUNTER — Other Ambulatory Visit: Payer: Self-pay | Admitting: Advanced Practice Midwife

## 2020-05-27 DIAGNOSIS — R1032 Left lower quadrant pain: Secondary | ICD-10-CM

## 2020-06-06 ENCOUNTER — Other Ambulatory Visit: Payer: Medicaid Other

## 2020-06-07 ENCOUNTER — Ambulatory Visit
Admission: RE | Admit: 2020-06-07 | Discharge: 2020-06-07 | Disposition: A | Discharge status: Home / Self Care | Payer: Medicaid Other | Source: Ambulatory Visit | Attending: Advanced Practice Midwife | Admitting: Advanced Practice Midwife

## 2020-06-07 DIAGNOSIS — R1032 Left lower quadrant pain: Secondary | ICD-10-CM

## 2021-06-04 IMAGING — CT CT ABDOMEN AND PELVIS WITH CONTRAST
2 of 4 series · 16 of 46 positions shown, 18 images · IV contrast (APPLIED)
Comparison: None.

CLINICAL DATA: Abdominal pain, nausea/vomiting, abdominal bloating

EXAM:
CT ABDOMEN AND PELVIS WITH CONTRAST
TECHNIQUE: Multidetector CT imaging of the abdomen and pelvis was performed
using the standard protocol following bolus administration of
intravenous contrast.
CONTRAST:  100mL OMNIPAQUE IOHEXOL 300 MG/ML  SOLN

[Series 3: abd/ pelvis 5.0 i30f 2 · axial · 0.88mm/px · z∈[+809,+1249]mm · 13 of 97 slices shown, 15 images]
[im 5/97  soft-tissue]
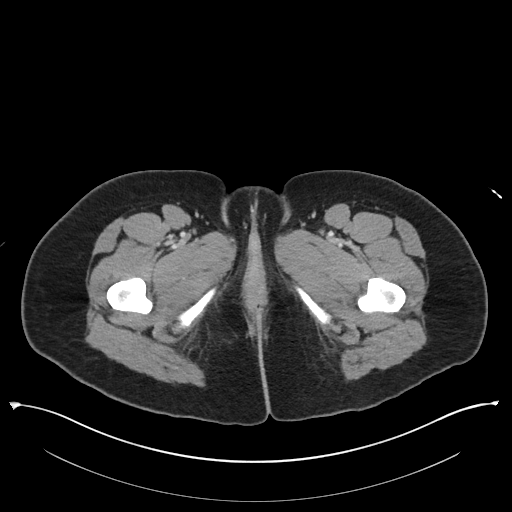
[im 5/97  bone]
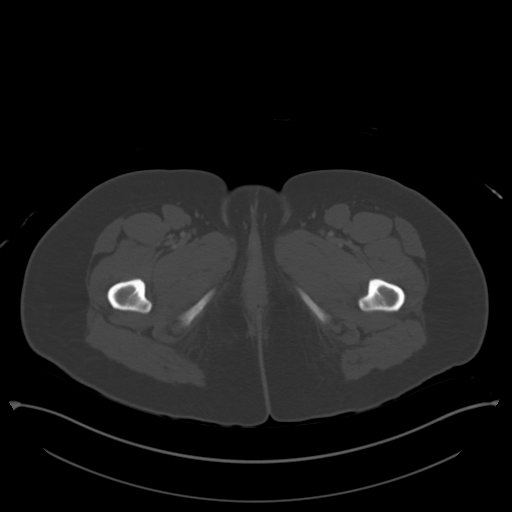
[im 13/97  soft-tissue]
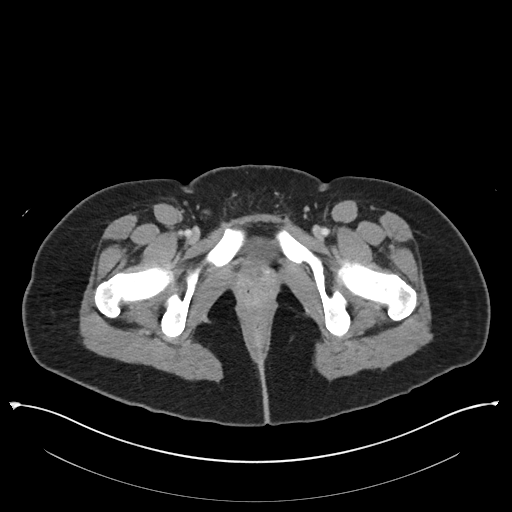
[im 21/97  soft-tissue]
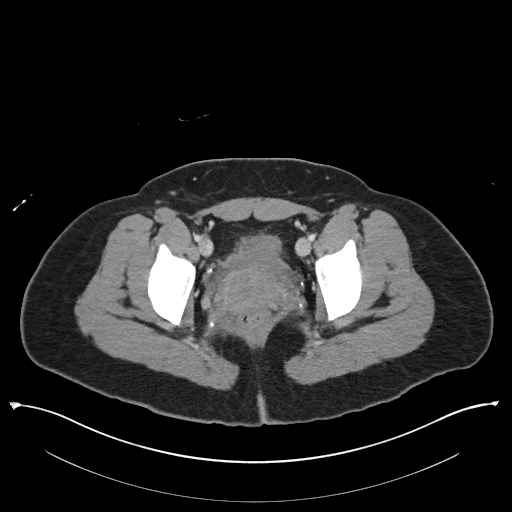
[im 29/97  soft-tissue]
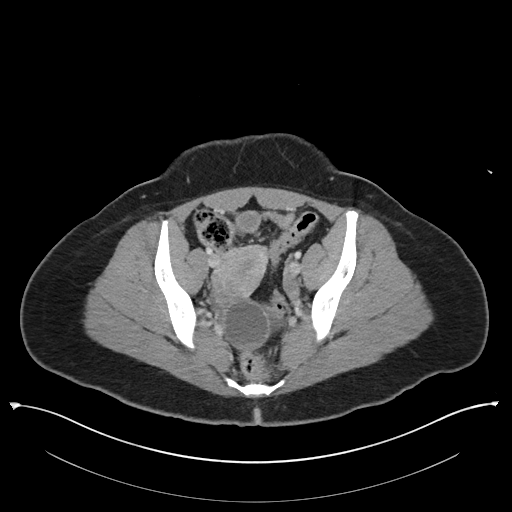
[im 33/97  soft-tissue]
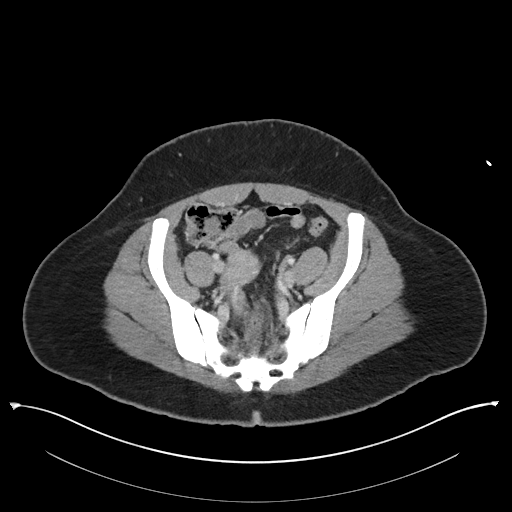
[im 41/97  soft-tissue]
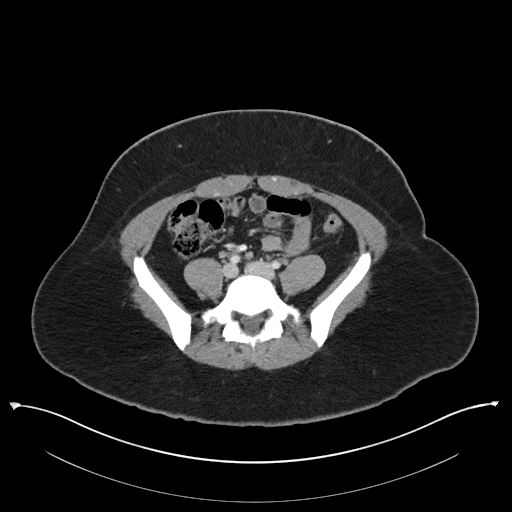
[im 49/97  soft-tissue]
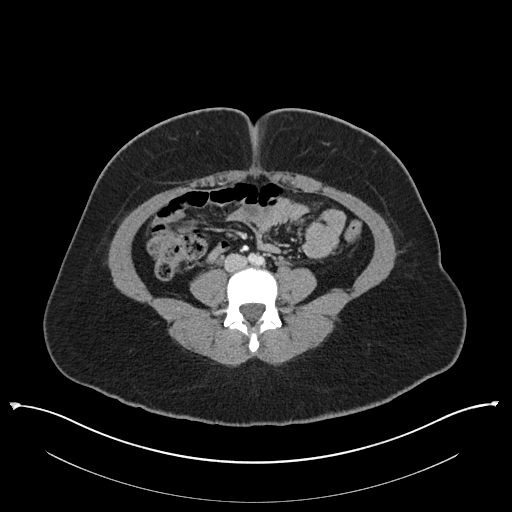
[im 57/97  soft-tissue]
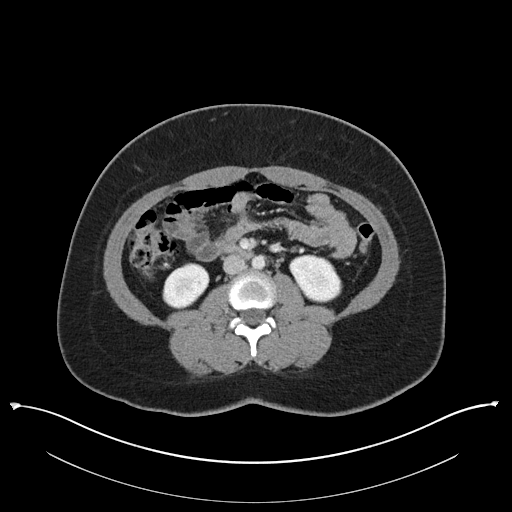
[im 65/97  soft-tissue]
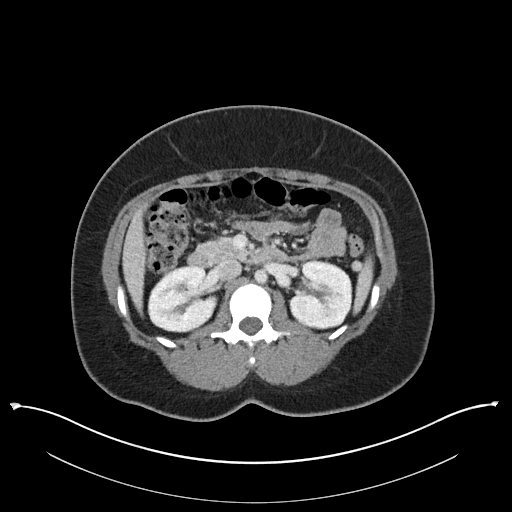
[im 65/97  bone]
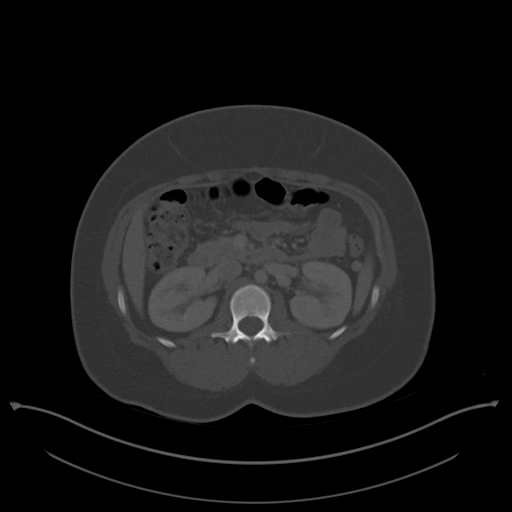
[im 69/97  soft-tissue]
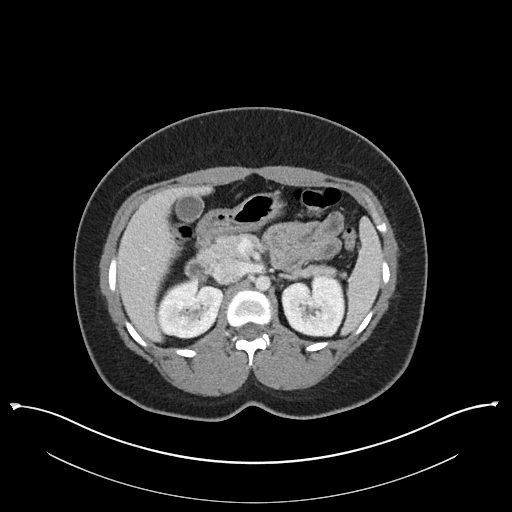
[im 77/97  soft-tissue]
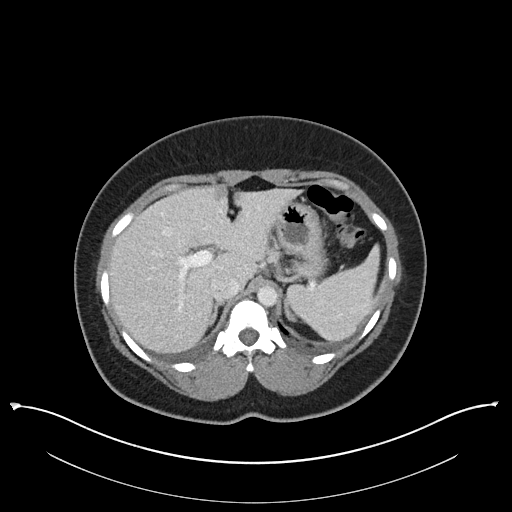
[im 85/97  soft-tissue]
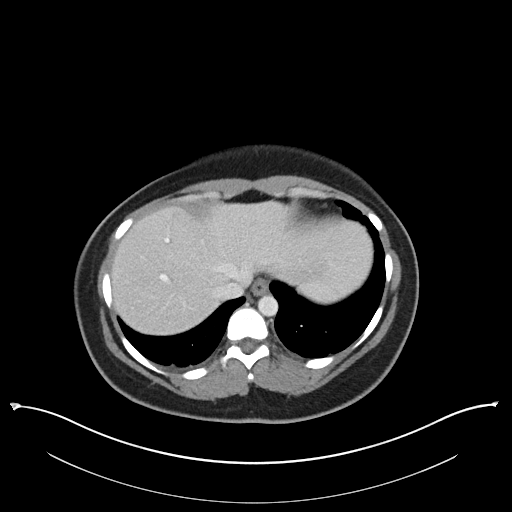
[im 93/97  soft-tissue]
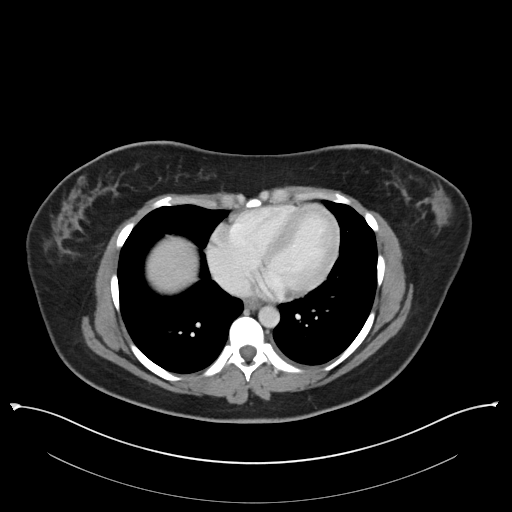

[Series 6: coronal soft tissue · coronal · 0.86mm/px · 3 of 96 slices shown]
[im 32/96  soft-tissue]
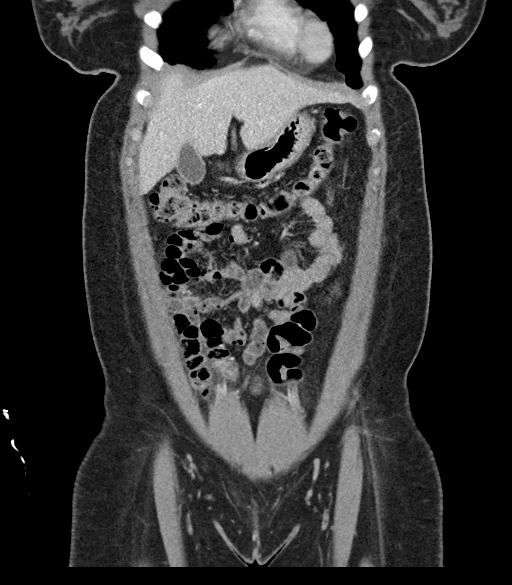
[im 43/96  soft-tissue]
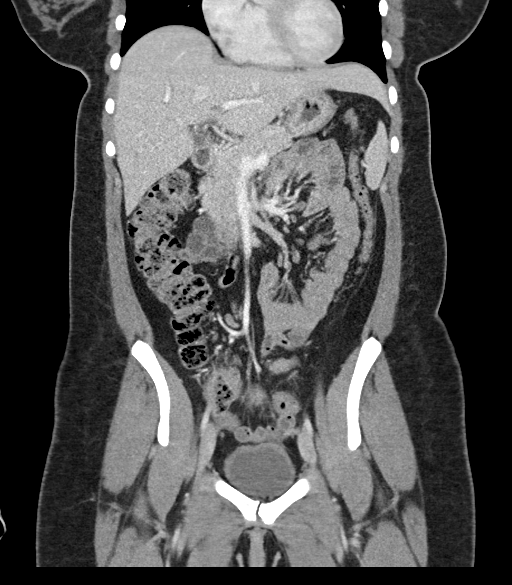
[im 53/96  soft-tissue]
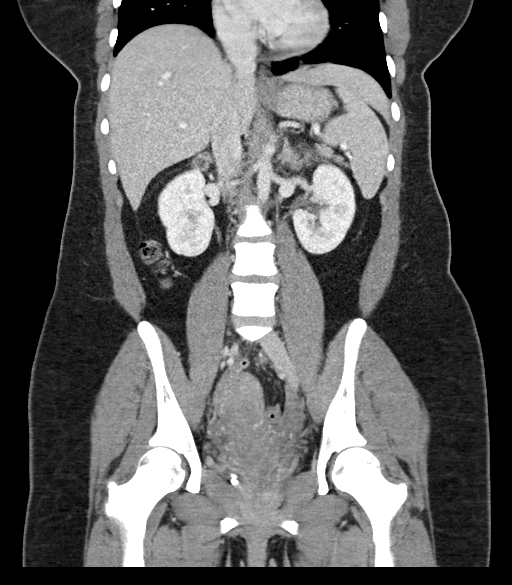

[16 of 46 positions shown; findings below may reference images not displayed]

FINDINGS: Lower chest: Lung bases are clear.

Hepatobiliary: Liver is within normal limits, noting focal
fat/altered perfusion along the falciform ligament.

Gallbladder is unremarkable. No intrahepatic or extrahepatic ductal
dilatation.

Pancreas: Within normal limits.

Spleen: Within normal limits.

Adrenals/Urinary Tract: Adrenal glands are within normal limits.

Kidneys are within normal limits.  No hydronephrosis.

Bladder is mildly thick-walled although underdistended.

Stomach/Bowel: Stomach is within normal limits.

No evidence of bowel obstruction.

Normal appendix (series 3/image 62).

Vascular/Lymphatic: No evidence of abdominal aortic aneurysm.

No suspicious abdominopelvic lymphadenopathy.

Reproductive: Uterus is within normal limits.

Bilateral ovaries are unremarkable. However, there is a 4.4 x 4.2 cm
mildly thick-walled right para ovarian cystic lesion with suspected
layering hemorrhage in the right pelvic cul-de-sac adjacent to the
right ovary (series 3/image 70).

Other: No abdominopelvic ascites.

Musculoskeletal: Visualized osseous structures are within normal
limits.
IMPRESSION: 4.4 cm right paraovarian cystic lesion with suspected layering
hemorrhage in the right pelvic cul-de-sac adjacent to the right
ovary, possibly reflecting a hemorrhagic cyst versus endometrioma.
Consider pelvic ultrasound for further characterization. In the
setting of pelvic pain, Doppler evaluation may be beneficial.

No evidence of bowel obstruction.  Normal appendix.

Otherwise unremarkable CT abdomen/pelvis.

## 2022-03-31 IMAGING — US US PELVIS COMPLETE WITH TRANSVAGINAL
1 series · 14 of 25 positions shown · non-contrast
Comparison: Pelvic ultrasound 08/12/2019

CLINICAL DATA: Left lower quadrant pain

EXAM:
TRANSABDOMINAL AND TRANSVAGINAL ULTRASOUND OF PELVIS
TECHNIQUE: Both transabdominal and transvaginal ultrasound examinations of the
pelvis were performed. Transabdominal technique was performed for
global imaging of the pelvis including uterus, ovaries, adnexal
regions, and pelvic cul-de-sac. It was necessary to proceed with
endovaginal exam following the transabdominal exam to visualize the
uterus endometrium ovaries.

[Series 1: us pelvis complete with transvaginal · 0.22mm/px · 14 of 62 slices shown]
[im 1/62]
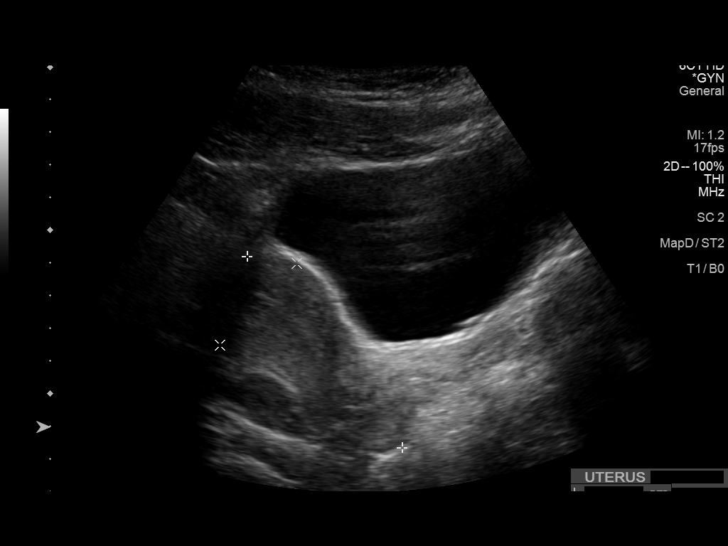
[im 6/62]
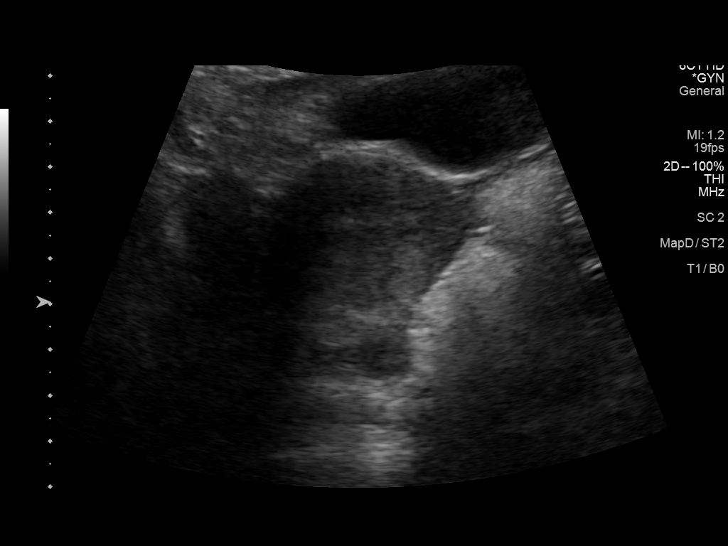
[im 11/62]
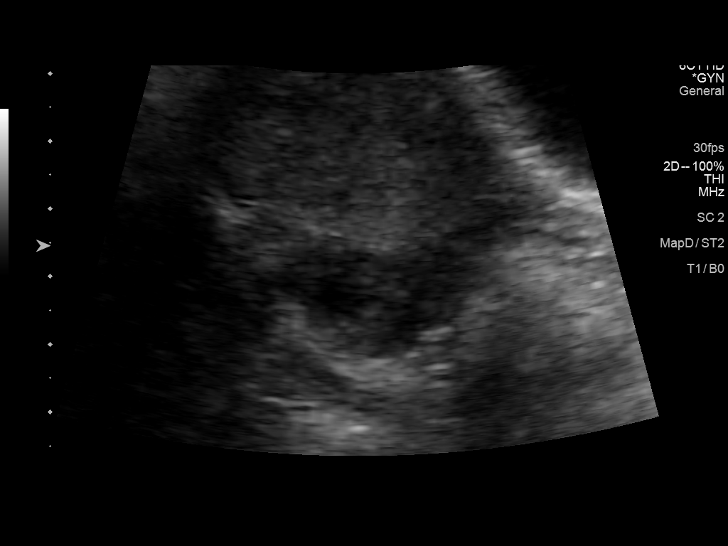
[im 16/62]
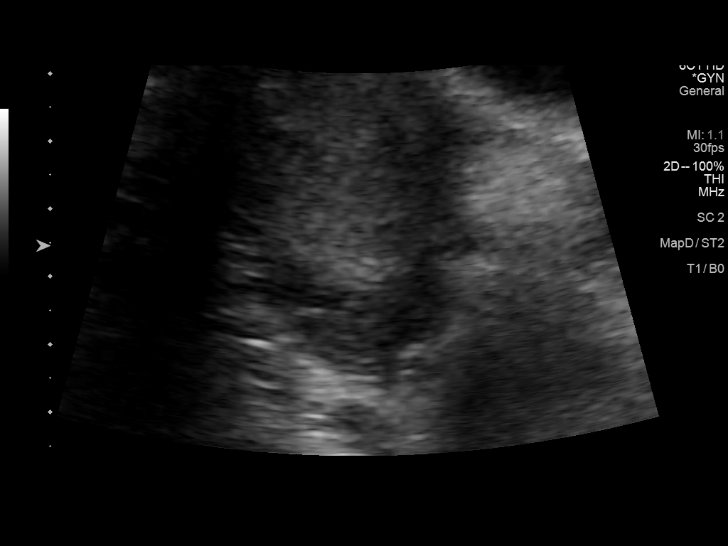
[im 21/62]
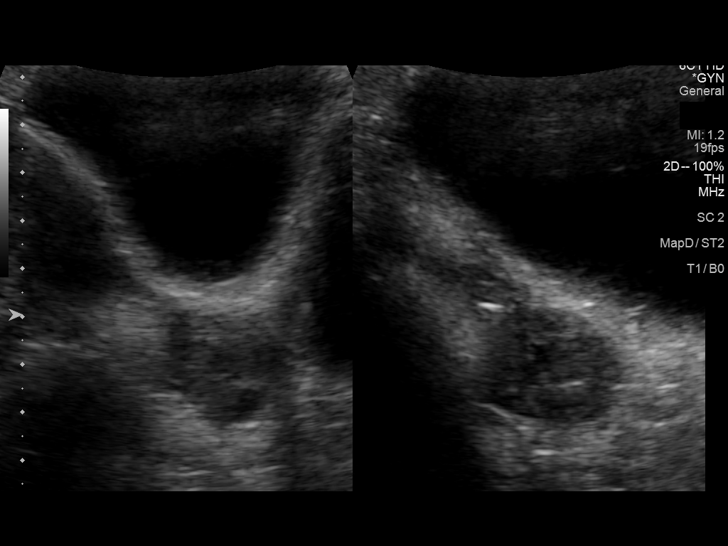
[im 23/62]
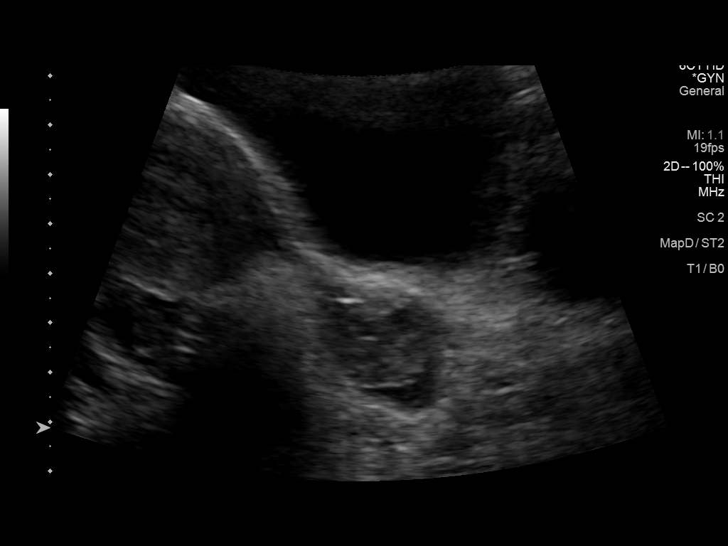
[im 28/62]
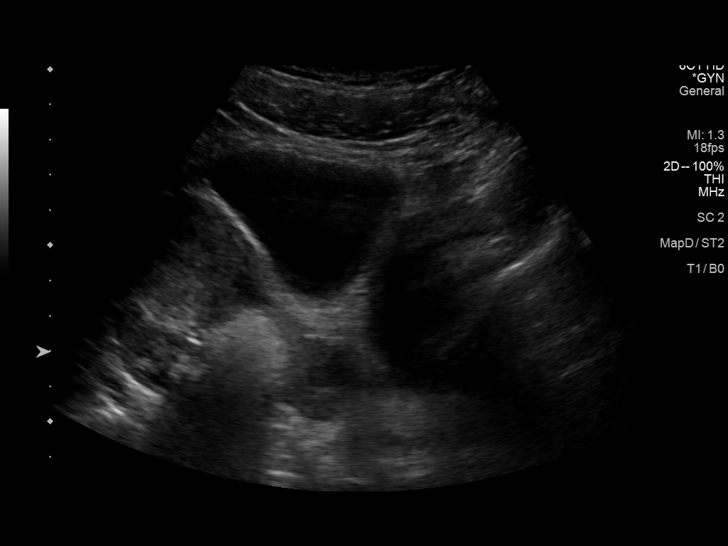
[im 34/62]
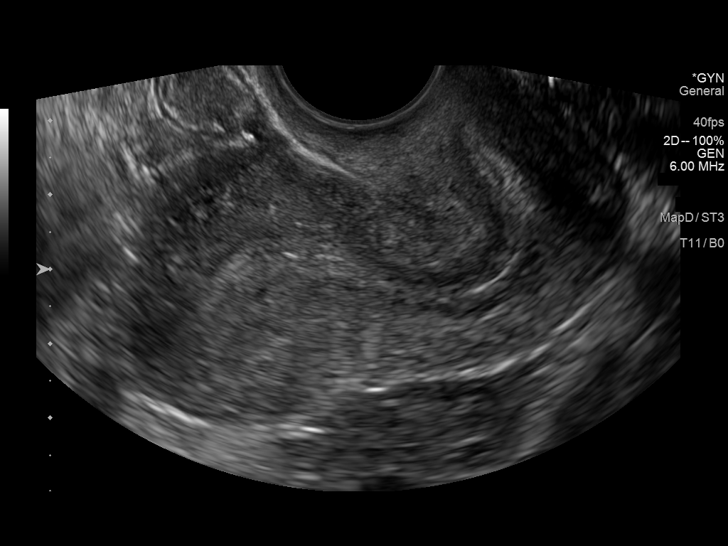
[im 39/62]
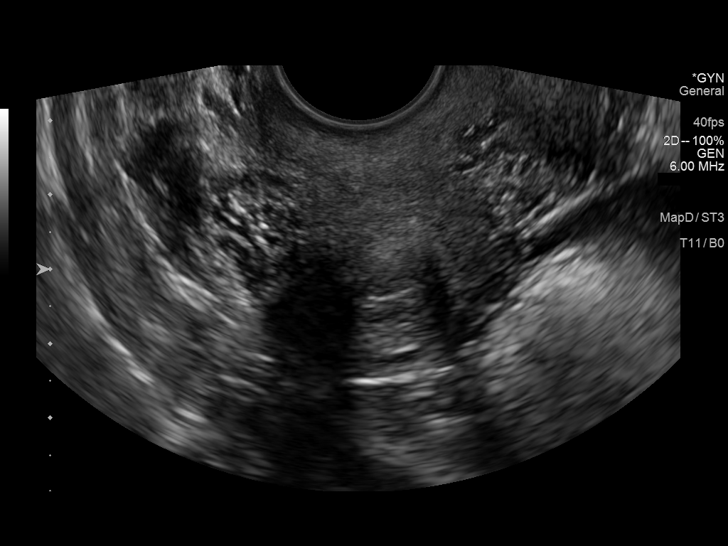
[im 41/62]
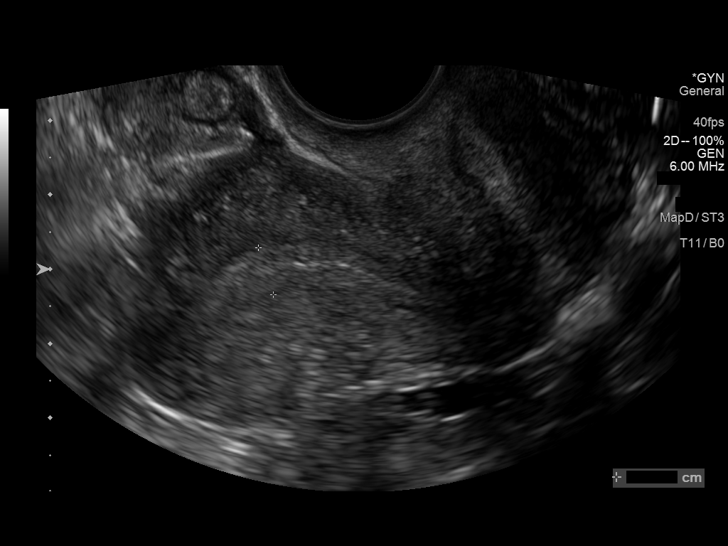
[im 46/62]
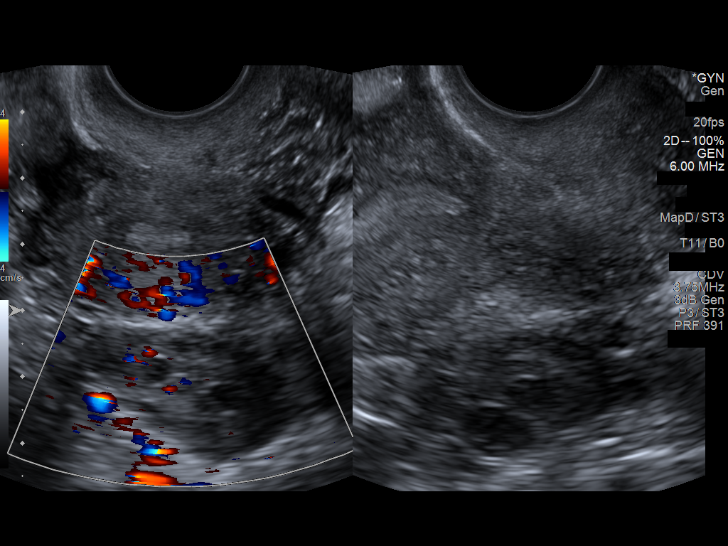
[im 51/62]
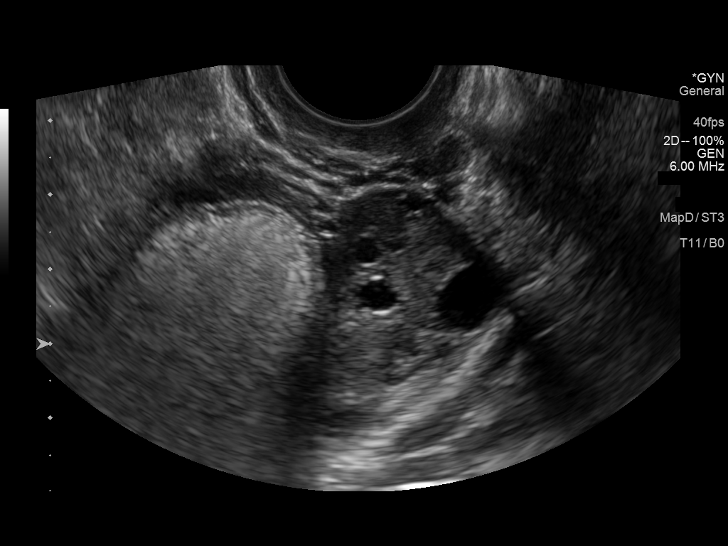
[im 56/62]
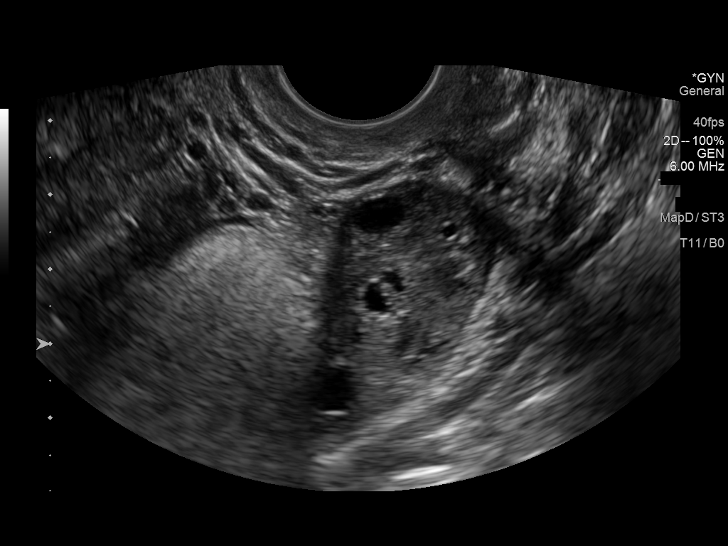
[im 62/62]
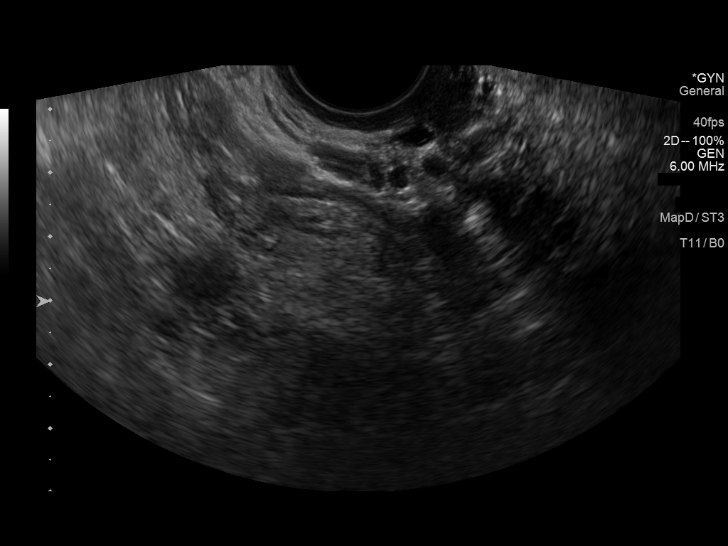

[14 of 25 positions shown; findings below may reference images not displayed]

FINDINGS: Uterus

Measurements: 7.5 x 3.4 x 3.7 cm = volume: 49 mL. No fibroids or
other mass visualized.

Endometrium

Thickness: 6.6 mm.  No focal abnormality visualized.

Right ovary

Measurements: 3.1 x 1.6 x 2.2 cm = volume: 6 mL. Normal
appearance/no adnexal mass.

Left ovary

Measurements: 3.8 x 2 x 2.2 cm = volume: 9 mL. Normal appearance/no
adnexal mass.

Other findings

Trace free fluid.
IMPRESSION: Trace free fluid.  Otherwise negative pelvic ultrasound
